# Patient Record
Sex: Female | Born: 1960
Health system: Southern US, Community
[De-identification: ages and names within clinical notes are randomized; demographics above are authoritative.]

## PROBLEM LIST (undated history)

## (undated) DIAGNOSIS — I10 Essential (primary) hypertension: Secondary | ICD-10-CM

## (undated) DIAGNOSIS — F419 Anxiety disorder, unspecified: Secondary | ICD-10-CM

## (undated) DIAGNOSIS — E7800 Pure hypercholesterolemia, unspecified: Secondary | ICD-10-CM

## (undated) DIAGNOSIS — M1990 Unspecified osteoarthritis, unspecified site: Secondary | ICD-10-CM

## (undated) DIAGNOSIS — F329 Major depressive disorder, single episode, unspecified: Secondary | ICD-10-CM

## (undated) DIAGNOSIS — T7840XA Allergy, unspecified, initial encounter: Secondary | ICD-10-CM

## (undated) DIAGNOSIS — F32A Depression, unspecified: Secondary | ICD-10-CM

## (undated) DIAGNOSIS — L409 Psoriasis, unspecified: Secondary | ICD-10-CM

## (undated) DIAGNOSIS — K219 Gastro-esophageal reflux disease without esophagitis: Secondary | ICD-10-CM

## (undated) DIAGNOSIS — N1831 Chronic kidney disease, stage 3a: Secondary | ICD-10-CM

## (undated) DIAGNOSIS — K8020 Calculus of gallbladder without cholecystitis without obstruction: Secondary | ICD-10-CM

## (undated) DIAGNOSIS — N200 Calculus of kidney: Secondary | ICD-10-CM

## (undated) DIAGNOSIS — E079 Disorder of thyroid, unspecified: Secondary | ICD-10-CM

## (undated) HISTORY — PX: CHOLECYSTECTOMY: SHX55

## (undated) HISTORY — DX: Calculus of gallbladder without cholecystitis without obstruction: K80.20

## (undated) HISTORY — PX: BREAST LUMPECTOMY: SHX2

## (undated) HISTORY — PX: COLONOSCOPY: SHX174

## (undated) HISTORY — DX: Pure hypercholesterolemia, unspecified: E78.00

## (undated) HISTORY — DX: Depression, unspecified: F32.A

## (undated) HISTORY — DX: Anxiety disorder, unspecified: F41.9

## (undated) HISTORY — DX: Chronic kidney disease, stage 3a: N18.31

## (undated) HISTORY — DX: Gastro-esophageal reflux disease without esophagitis: K21.9

## (undated) HISTORY — PX: UPPER GASTROINTESTINAL ENDOSCOPY: SHX188

## (undated) HISTORY — DX: Disorder of thyroid, unspecified: E07.9

## (undated) HISTORY — DX: Unspecified osteoarthritis, unspecified site: M19.90

## (undated) HISTORY — DX: Calculus of kidney: N20.0

## (undated) HISTORY — DX: Essential (primary) hypertension: I10

## (undated) HISTORY — DX: Psoriasis, unspecified: L40.9

## (undated) HISTORY — PX: ABDOMINAL HYSTERECTOMY: SHX81

## (undated) HISTORY — DX: Allergy, unspecified, initial encounter: T78.40XA

## (undated) HISTORY — DX: Major depressive disorder, single episode, unspecified: F32.9

---

## 1997-11-18 ENCOUNTER — Other Ambulatory Visit: Admission: RE | Admit: 1997-11-18 | Discharge: 1997-11-18 | Payer: Self-pay | Admitting: Obstetrics and Gynecology

## 1998-12-07 ENCOUNTER — Other Ambulatory Visit: Admission: RE | Admit: 1998-12-07 | Discharge: 1998-12-07 | Payer: Self-pay | Admitting: Obstetrics and Gynecology

## 1999-01-14 ENCOUNTER — Ambulatory Visit (HOSPITAL_COMMUNITY): Admission: RE | Admit: 1999-01-14 | Discharge: 1999-01-14 | Payer: Self-pay | Admitting: Obstetrics and Gynecology

## 2000-01-25 ENCOUNTER — Other Ambulatory Visit: Admission: RE | Admit: 2000-01-25 | Discharge: 2000-01-25 | Payer: Self-pay | Admitting: Obstetrics and Gynecology

## 2001-10-05 ENCOUNTER — Encounter (INDEPENDENT_AMBULATORY_CARE_PROVIDER_SITE_OTHER): Payer: Self-pay | Admitting: Specialist

## 2001-10-05 ENCOUNTER — Inpatient Hospital Stay (HOSPITAL_COMMUNITY): Admission: EM | Admit: 2001-10-05 | Discharge: 2001-10-07 | Payer: Self-pay | Admitting: Emergency Medicine

## 2001-10-05 ENCOUNTER — Encounter: Payer: Self-pay | Admitting: Emergency Medicine

## 2001-10-06 ENCOUNTER — Encounter: Payer: Self-pay | Admitting: Surgery

## 2002-03-19 ENCOUNTER — Other Ambulatory Visit: Admission: RE | Admit: 2002-03-19 | Discharge: 2002-03-19 | Payer: Self-pay | Admitting: Obstetrics and Gynecology

## 2002-05-20 ENCOUNTER — Ambulatory Visit (HOSPITAL_COMMUNITY): Admission: RE | Admit: 2002-05-20 | Discharge: 2002-05-20 | Payer: Self-pay | Admitting: Obstetrics and Gynecology

## 2002-05-20 ENCOUNTER — Encounter: Payer: Self-pay | Admitting: Obstetrics and Gynecology

## 2002-06-19 ENCOUNTER — Observation Stay (HOSPITAL_COMMUNITY): Admission: RE | Admit: 2002-06-19 | Discharge: 2002-06-20 | Payer: Self-pay | Admitting: Obstetrics and Gynecology

## 2002-06-19 ENCOUNTER — Encounter (INDEPENDENT_AMBULATORY_CARE_PROVIDER_SITE_OTHER): Payer: Self-pay | Admitting: Specialist

## 2003-06-01 ENCOUNTER — Other Ambulatory Visit: Admission: RE | Admit: 2003-06-01 | Discharge: 2003-06-01 | Payer: Self-pay | Admitting: Obstetrics and Gynecology

## 2004-06-03 ENCOUNTER — Other Ambulatory Visit: Admission: RE | Admit: 2004-06-03 | Discharge: 2004-06-03 | Payer: Self-pay | Admitting: Obstetrics and Gynecology

## 2005-12-29 ENCOUNTER — Ambulatory Visit (HOSPITAL_COMMUNITY): Admission: RE | Admit: 2005-12-29 | Discharge: 2005-12-29 | Payer: Self-pay | Admitting: Obstetrics and Gynecology

## 2005-12-29 ENCOUNTER — Encounter (INDEPENDENT_AMBULATORY_CARE_PROVIDER_SITE_OTHER): Payer: Self-pay | Admitting: Specialist

## 2007-07-23 ENCOUNTER — Encounter: Admission: RE | Admit: 2007-07-23 | Discharge: 2007-07-23 | Payer: Self-pay | Admitting: Gastroenterology

## 2007-08-20 ENCOUNTER — Encounter: Admission: RE | Admit: 2007-08-20 | Discharge: 2007-08-20 | Payer: Self-pay | Admitting: Gastroenterology

## 2007-09-03 ENCOUNTER — Ambulatory Visit (HOSPITAL_COMMUNITY): Admission: RE | Admit: 2007-09-03 | Discharge: 2007-09-03 | Payer: Self-pay | Admitting: Gastroenterology

## 2007-09-11 ENCOUNTER — Emergency Department (HOSPITAL_COMMUNITY): Admission: EM | Admit: 2007-09-11 | Discharge: 2007-09-11 | Payer: Self-pay | Admitting: Emergency Medicine

## 2008-06-04 ENCOUNTER — Encounter: Admission: RE | Admit: 2008-06-04 | Discharge: 2008-06-04 | Payer: Self-pay | Admitting: Obstetrics and Gynecology

## 2010-01-26 IMAGING — RF DG ESOPHAGUS
12 of 14 series · 19 of 24 positions shown · non-contrast
Comparison: [REDACTED] chest x-ray report 10/06/2001

CLINICAL DATA: Heartburn and indigestion.  Intermittent solid
dysphasia upper esophagus.  Cholecystectomy.  Kapidex.

BARIUM SWALLOW / ESOPHAGRAM:
TECHNIQUE: Double and single contrast barium swallow.  Ingested 13
mm barium tablet with water.

[Series 1: run · 1 of 3 slices shown (1 of 12)]
[im 1/3]
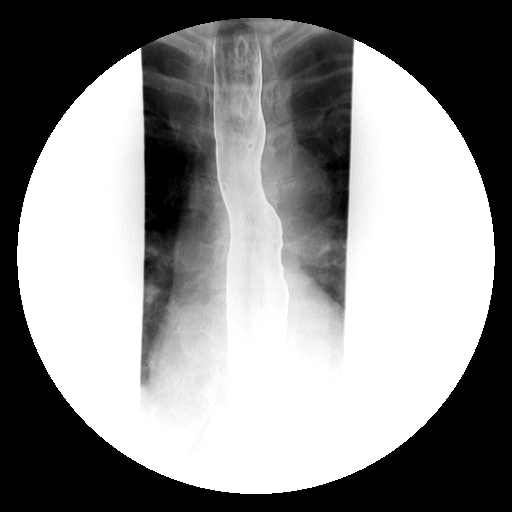

[Series 2: run · 1 of 2 slices shown (2 of 12)]
[im 1/2]
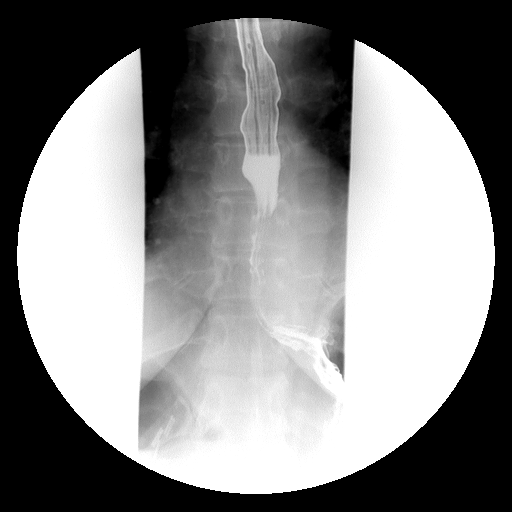

[Series 4: run · 1 of 6 slices shown (3 of 12)]
[im 1/6]
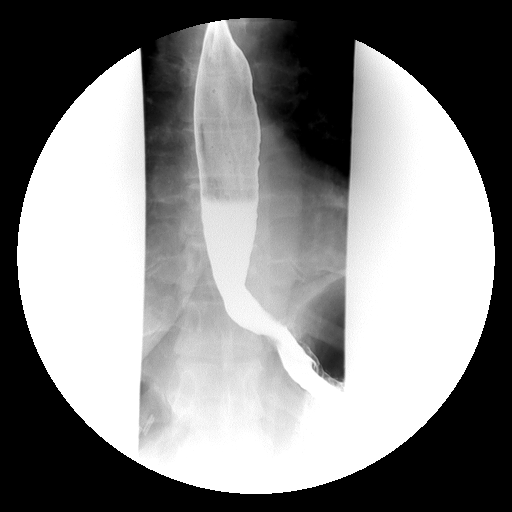

[Series 5: run · 2 of 10 slices shown (4 of 12)]
[im 1/10]
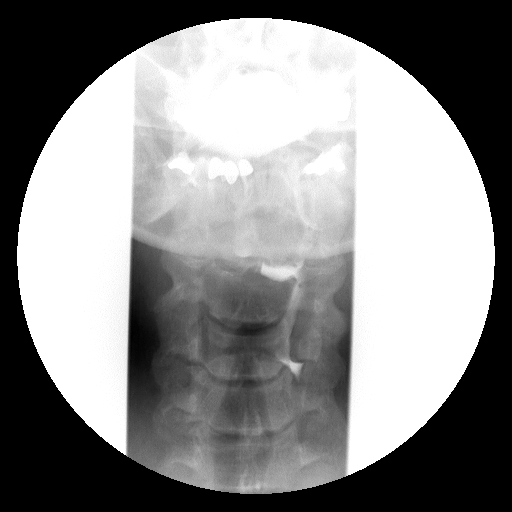
[im 10/10]
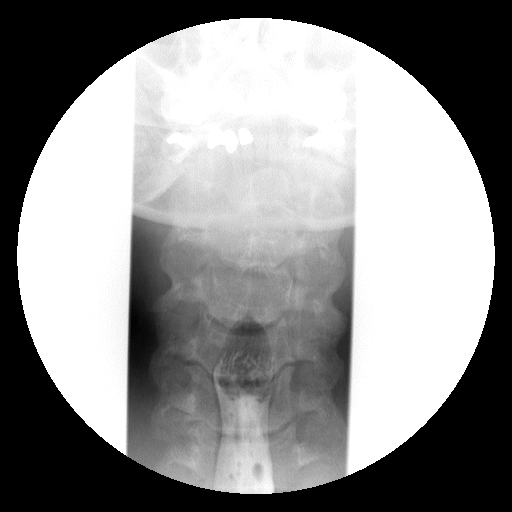

[Series 6: run · 1 of 3 slices shown (5 of 12)]
[im 1/3]
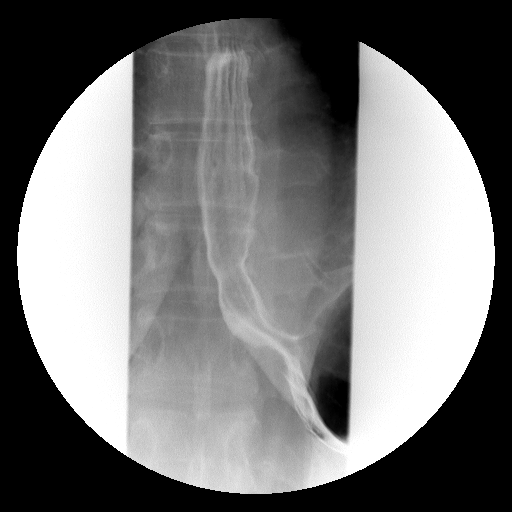

[Series 8: run · 1 of 1 slices shown (6 of 12)]
[im 1/1]
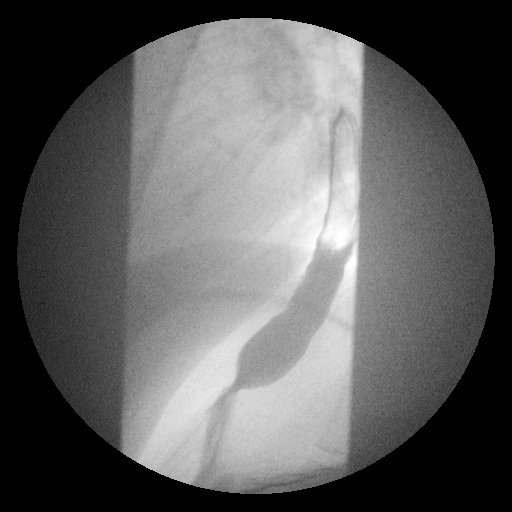

[Series 9: run · 5 of 21 slices shown (7 of 12)]
[im 1/21]
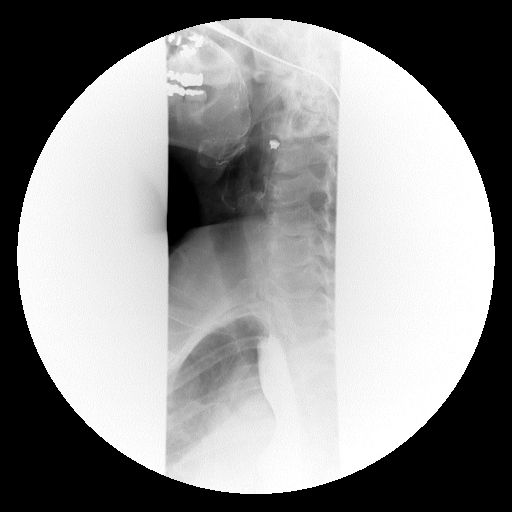
[im 5/21]
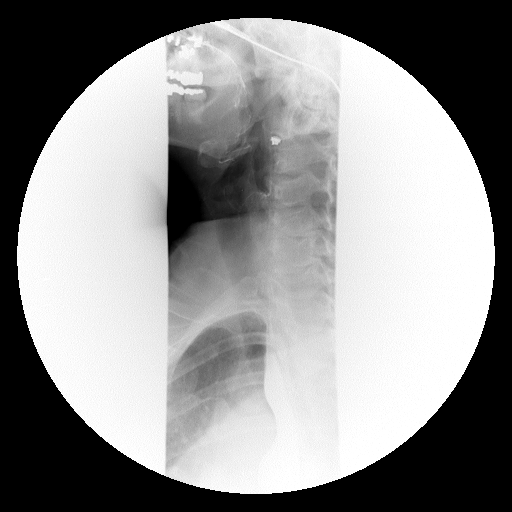
[im 13/21]
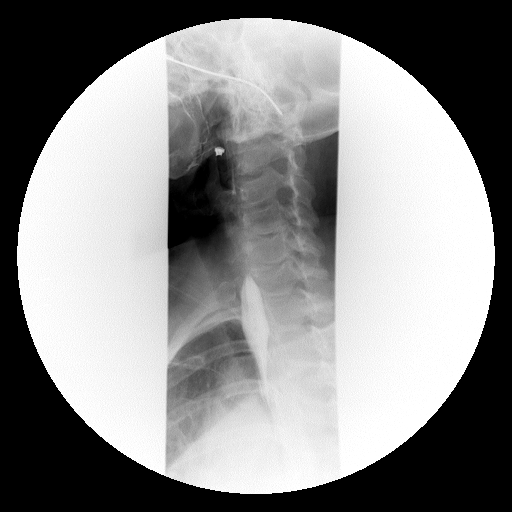
[im 17/21]
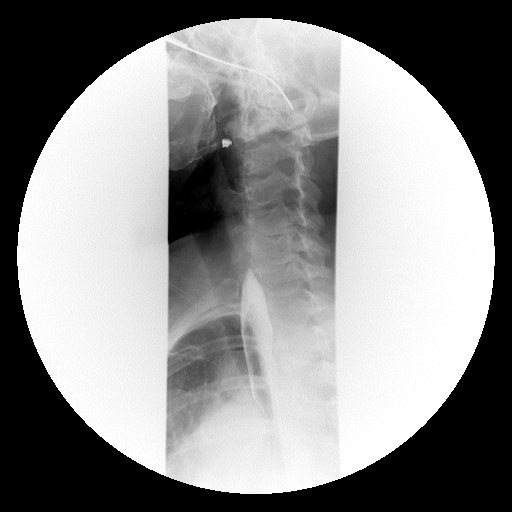
[im 21/21]
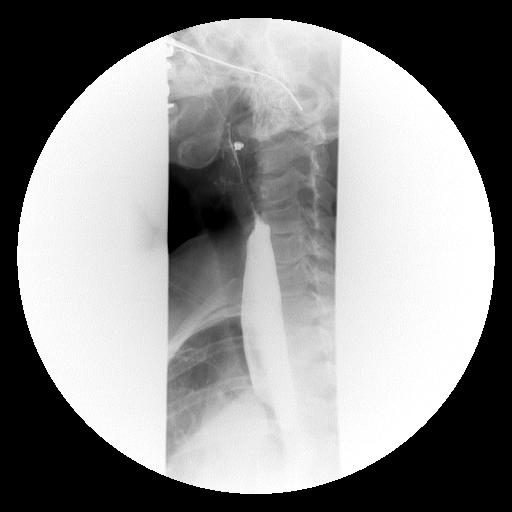

[Series 10: run · 2 of 11 slices shown (8 of 12)]
[im 1/11]
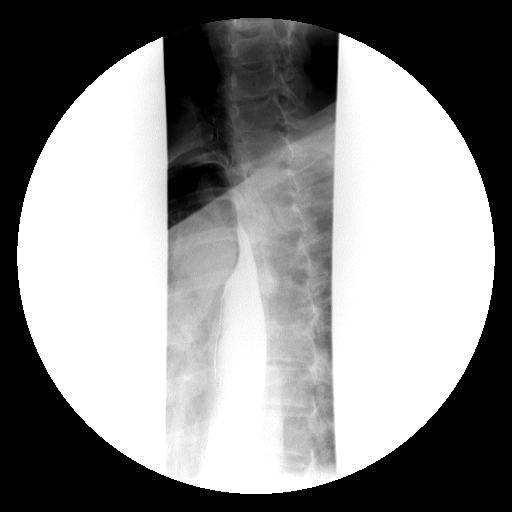
[im 11/11]
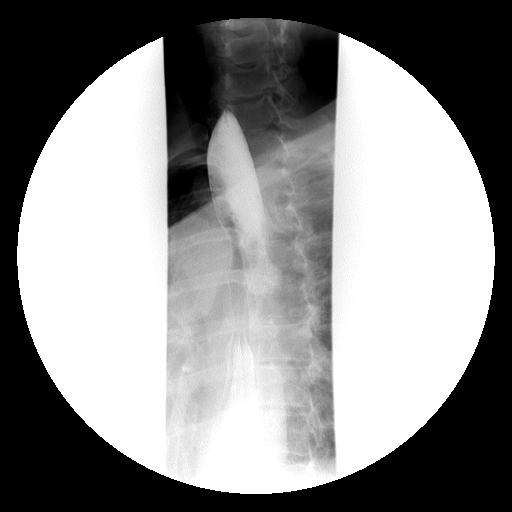

[Series 11: run · 1 of 4 slices shown (9 of 12)]
[im 1/4]
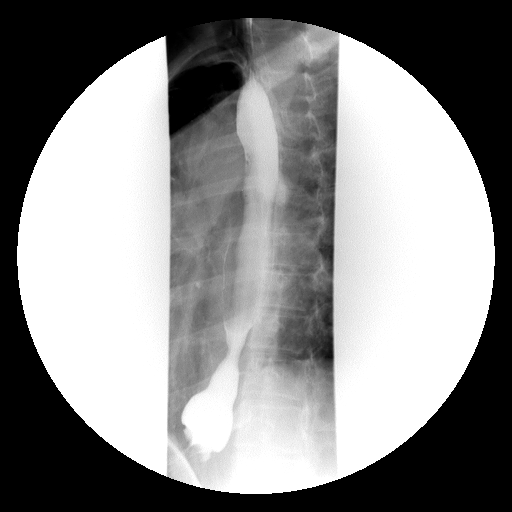

[Series 12: run · 2 of 11 slices shown (10 of 12)]
[im 1/11]
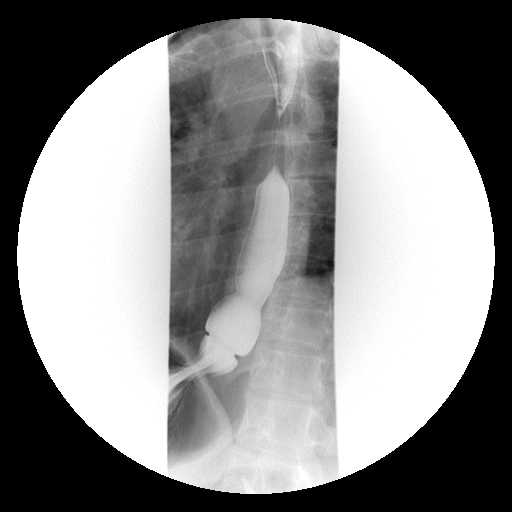
[im 6/11]
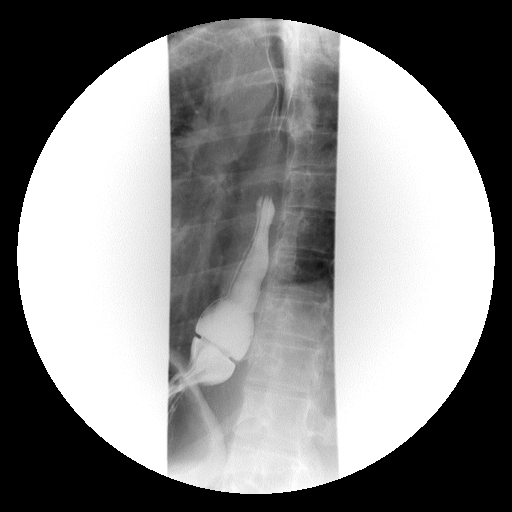

[Series 13: run · 1 of 1 slices shown (11 of 12)]
[im 1/1]
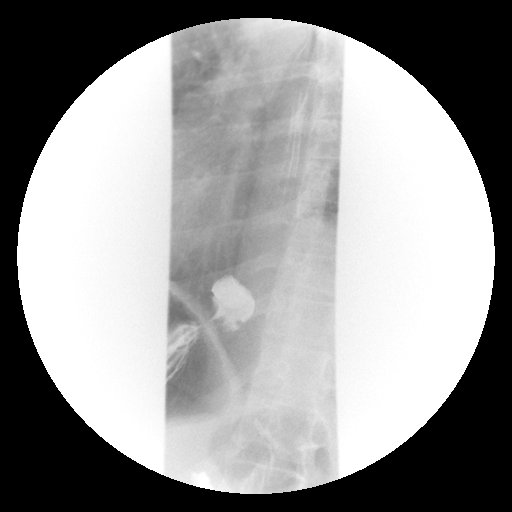

[Series 14: run · 1 of 1 slices shown (12 of 12)]
[im 1/1]
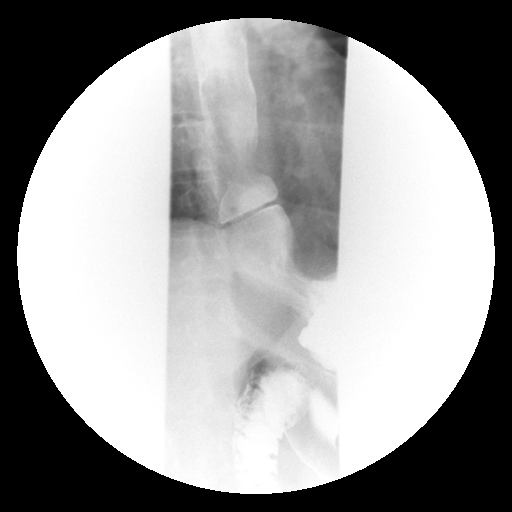

[19 of 24 positions shown; findings below may reference images not displayed]

FINDINGS: Normal antegrade peristalsis is seen through the cervical
and thoracic esophagus.  Small sliding type hiatus hernia is seen
with nonobstructing lower esophageal ring.  Ingested 13 mm barium
tablet readily passed into the stomach.  No current spontaneous or
induced (Valsalva/water siphon) gastroesophageal reflux was
demonstrated.  No other significant intrinsic or extrinsic
esophageal lesion is noted.
IMPRESSION: 1.  Small sliding type hiatus hernia with nonobstructing lower
esophageal ring.
2.  Otherwise, negative.

## 2010-03-06 ENCOUNTER — Encounter: Payer: Self-pay | Admitting: Obstetrics and Gynecology

## 2010-06-28 NOTE — Op Note (Signed)
Heidi Holt, Heidi Holt                    ACCOUNT NO.:  [id number]   MEDICAL RECORD NO.:  [id number]          PATIENT TYPE:  AMB   LOCATION:  ENDO                         FACILITY:  [facility]   PHYSICIAN:  Graylin Shiver, M.D.   DATE OF BIRTH:  July 28, 1960   DATE OF PROCEDURE:  09/03/2007  DATE OF DISCHARGE:                               OPERATIVE REPORT   PROCEDURE:  Esophagogastroduodenoscopy with biopsy for CLO-test and  endoscopic balloon dilatation of a Schatzki ring.   INDICATIONS:  Epigastric abdominal pain, dysphagia.  Barium swallow  showed a Schatzki ring.   Informed consent was obtained after explanation of the risks of  bleeding, infection, and perforation.   PREMEDICATION:  Fentanyl 100 mcg IV, Versed 10 mg IV.   PROCEDURE IN DETAIL:  With the patient in the left lateral decubitus  position, the Pentax gastroscope was inserted into the oropharynx and  passed into the esophagus.  It was advanced down the esophagus then into  the stomach and into the duodenum.  The second portion and bulb of the  duodenum looked normal.  The antrum of stomach was erythematous,  compatible with a mild antral gastritis.  A biopsy for CLO-test was  obtained.  No ulcers or erosions were seen.  The body of the stomach  looked normal.  The fundus and cardia had normal-appearing mucosa.  There was a small hiatal hernia.  The distal esophagus revealed a  Schatzki ring.  This did not narrow the lumen at tremendous amount but  it did a little bit with contraction.  An endoscopic balloon dilator was  advanced down the scope and appropriately placed at the level of the  ring.  It was inflated to 16.5, then 18 mm and held in place at each  level for 1 minute.  The rest of the esophagus looked normal.  She  tolerated the procedure well without complications.   IMPRESSION:  1. Schatzki ring, dilated to 18 mm.  2. Hiatal hernia.  3. Antral gastritis.  Biopsy for CLO-test obtained.     ______________________________  Graylin Shiver, M.D.     SFG/MEDQ  D:  09/03/2007  T:  09/04/2007  Job:  [id number]   cc:   Tally Joe, M.D.
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
## 2010-07-01 NOTE — Op Note (Signed)
NAME:  Heidi Holt, Heidi Holt NO.:  [id number]   MEDICAL RECORD NO.:  [id number]                   PATIENT TYPE:   LOCATION:                                       FACILITY:   PHYSICIAN:  Sandria Bales. Ezzard Standing, M.D.               DATE OF BIRTH:  April 18, 1960   DATE OF PROCEDURE:  10/06/2001  DATE OF DISCHARGE:                                 OPERATIVE REPORT   PREOPERATIVE DIAGNOSES:  Cholecystitis with cholelithiasis.   POSTOPERATIVE DIAGNOSES:  Cholecystitis with cholelithiasis.   OPERATION PERFORMED:  Laparoscopic cholecystectomy with intraoperative  cholangiogram.   SURGEON:  Sandria Bales. Ezzard Standing, M.D.   ASSISTANT:  Adolph Pollack, M.D.   ANESTHESIA:  General endotracheal.   ESTIMATED BLOOD LOSS:  Minimal.   INDICATIONS FOR PROCEDURE:  The patient is a 50 year old white female who  has had some chronic symptoms actually going on almost a year but presented  acutely yesterday to the emergency department with right upper quadrant  pain.  She had a normal white blood cell count.  She had ultrasound which  showed apparent impacted gallstone.  She is taken to the operating room for  attempted laparoscopic cholecystectomy.   DESCRIPTION OF PROCEDURE:  The patient was placed in supine position and  given a general endotracheal anesthetic.  She already had Ancef as an  antibiotic.  She had PAS stockings in place.  Orogastric tube was in place.  Her abdomen was prepped with Betadine solution and sterilely draped.  An  infraumbilical incision was made with sharp dissection carried down into the  abdominal cavity.  A 0 degree laparoscope was then inserted through a 12 mm  Hasson trocar.  The Hasson trocar was secured with a 0 Vicryl suture.  The  right and left lobe of the liver was unremarkable.  The stomach was  unremarkable.  The remainder of the bowel that I could see was unremarkable.  Three additional trocars were placed.  A 10 mm Ethicon trocar in the  subxiphoid location, a 5 mm Ethicon trocar in the right midsubcostal and a 5  mm Ethicon trocar in the right lateral subcostal location.  The gallbladder  was grasped, rotated cephalad.  It had a lot of adhesions between the  duodenum and the gallbladder.  Actually, the gallbladder was somewhat  peculiar in shape in that it went down paralleling the common bile duct  actually the infundibulum wrapped kind of behind the common bile duct.  It  made a little bit of a confusing dissection,  but I thought I identified the  common bile duct well and did not damage it.  I did have to tease off the  gallbladder off the common bile duct.  Finally got it straightened out where  I could develop a window showing the triangle of Calot.  The  cystic artery  was doubly endoclipped and divided.  I then shot an intraoperative  cholangiogram.   Intraoperative cholangiogram was shot using a cut off taut catheter inserted  through a 14 gauge Jelco into the side of the cut cystic duct.  Contrast was  injected down the cystic duct into the common bile duct and the duodenum.  There was no filling defect, no mass.  This was felt to be a normal  intraoperative cholangiogram.  I then removed the taut catheter and placed  three clips across the cystic duct and divided the cystic duct.  I then  sharply and bluntly dissected the gallbladder from the gallbladder bed.  The  gallbladder was actually noted to be fairly intrahepatic and had some  bleeding along the gallbladder bed which was controlled with Bovie  electrocautery.  I then put a half a piece of Surgicel into the gallbladder  bed.  __________  gallbladder bed, placed an EndoCatch bag and delivered it  through the umbilicus.   Went back and irrigated the abdomen with about 500 to 700 cc of saline.  Visualized the gallbladder bed, revisualized the triangle of Calot.  Saw no  bleeding at either site nor bile leak.  I then removed all my trocars under  direct  visualization.  The umbilical trocar was closed with a 0 Vicryl  suture.  The skin at each site was closed with a 5-0 Vicryl suture painted  with tincture of benzoin, steri-stripped and sterilely dressed.  The patient  tolerated the procedure well and was transported to the recovery room in  good condition.  Sponge and needle count correct at the end of the case.                                                 Sandria Bales. Ezzard Standing, M.D.    DHN/MEDQ  D:  10/06/2001  T:  10/08/2001  Job:  [id number]   cc:   Guy Sandifer. Arleta Creek, M.D.   [facility]
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
## 2010-07-01 NOTE — Discharge Summary (Signed)
NAME:  Heidi Holt, Heidi Holt                              ACCOUNT NO.:  [id number]   MEDICAL RECORD NO.:  [id number]                   PATIENT TYPE:  INP   LOCATION:  5733                                 FACILITY:  [facility]   PHYSICIAN:  Sandria Bales. Ezzard Standing, M.D.               DATE OF BIRTH:  April 07, 1960   DATE OF ADMISSION:  10/05/2001  DATE OF DISCHARGE:  10/07/2001                                 DISCHARGE SUMMARY   DISCHARGE DIAGNOSES:  1. Subacute cholecystitis with chololithiasis.  2. History of anxiety.  3. LATEX allergy.   OPERATION:  The patient had a laparoscopic cholecystectomy with  intraoperative cholangiogram on October 06, 2001.   HISTORY OF PRESENT ILLNESS:  The patient is a 50 year old white female who  is followed by [facility] and by Dr. Guy Sandifer. Tomblin from a GYN standpoint,  who has had vague abdominal symptoms for approximately one year.  Over the  last three weeks prior to this admission she had increasing vague abdominal  complaints and pain, but did not seek medical attention until on August 23rd  she awoke with severe pain which was located to her right upper quadrant,  with nausea.  She had no other history of GI symptoms or problems.  She did  have a strong family history of gallstone and gallstone disease.   ALLERGIES:  LATEX allergy.   MEDICATIONS:  Lexapro for anxiety.   SOCIAL HISTORY:  She works as a Catering manager.   PHYSICAL EXAMINATION:  VITAL SIGNS:  Temperature 97.6 degrees, pulse 87.  ABDOMEN:  She had right upper quadrant soreness but without rebound.   LABORATORY DATA:  Her white blood count was 8800.  Her alkaline phosphatase  was 83.  Her other labs were all within normal limits.   Her ultrasound showed what appeared to be a solitary gallstone, but this  appeared to be in impacted in her gallbladder neck.   HOSPITAL COURSE:  She was admitted with a diagnosis of subacute  cholecystitis.  Unfortunately she had drunk some liquids right before I saw  her, so I decided to  admit her for surgery, and took her to the operating  room on the following day where I did a laparoscopic cholecystectomy with  intraoperative cholangiogram.  She has done well.  She is eating.  She is  afebrile.   DISPOSITION:  She is now ready for discharge.   DISCHARGE INSTRUCTIONS:  Vicodin for pain.  She should not do heavy lifting  or driving for three or four days.  She can resume working in about a week.  She should be on a low-fat diet.  She can shower starting tomorrow.   FOLLOW UP:  She will see me back in two weeks for a follow-up evaluation.  Her pathology report is pending at the time of this dictation.  Sandria Bales. Ezzard Standing, M.D.    DHN/MEDQ  D:  10/07/2001  T:  10/08/2001  Job:  [id number]   cc:   [facility]   Guy Sandifer. Arleta Creek, M.D.

## 2010-07-01 NOTE — Op Note (Signed)
NAME:  Heidi Holt, Heidi Holt                              ACCOUNT NO.:  [id number]   MEDICAL RECORD NO.:  [id number]                   PATIENT TYPE:  OBV   LOCATION:  9327                                 FACILITY:  [facility]   PHYSICIAN:  Guy Sandifer. Arleta Creek, M.D.           DATE OF BIRTH:  1960-07-02   DATE OF PROCEDURE:  06/19/2002  DATE OF DISCHARGE:                                 OPERATIVE REPORT   PREOPERATIVE DIAGNOSES:  Menorrhagia.   POSTOPERATIVE DIAGNOSES:  1. Menorrhagia.  2. Endometriosis.   PROCEDURE:  Laparoscopically assisted vaginal hysterectomy and ablation of  endometriosis.   SURGEON:  Guy Sandifer. Henderson Cloud, M.D.   ASSISTANT:  Duke Salvia. Marcelle Overlie, M.D.   ANESTHESIA:  1. General with endotracheal intubation.  2. 0.5% plain Marcaine local anesthetic to the incisions.   ESTIMATED BLOOD LOSS:  200 mL.   SPECIMENS:  Uterus.   INDICATIONS AND CONSENT:  This patient is a 50 year old married white female  G2, P2 status post tubal ligation with menorrhagia.  Details are dictated in  the history and physical.  Laparoscopically assisted vaginal hysterectomy is  discussed with the patient.  Retention of ovaries is discussed if they  appear normal.  Potential risks and complications are discussed including,  but not limited to, infection, bowel, bladder, ureteral damage, bleeding  requiring transfusion of blood products with possible transfusion reaction,  HIV and hepatitis acquisition, DVT, PE, pneumonia, fistula formation,  laparotomy, and dyspareunia.  All questions have been answered and consent  is signed on the chart.   FINDINGS:  Upper abdomen is grossly normal.  There is a dark black implant  of endometriosis immediately about the pelvic brim on the left and right  anterior abdominal wall.  Uterus is about 6 weeks in size, smooth in  contour.  Filshie clips are on the fallopian tubes bilaterally.  Ovaries are  normal bilaterally.  Anterior/posterior cul-de-sacs are normal.   PROCEDURE:  The patient is taken to the operating room, placed in the dorsal  supine position where general anesthesia is induced via endotracheal  intubation.  She is then placed in the dorsal lithotomy position where she  is prepped abdominally and vaginally.  Bladder straight catheterized.  Hulka  tenaculum was placed in the uterus as a manipulator and she is draped in a  sterile fashion.  Latex free materials and gloves are used.  Small  infraumbilical incision is made and with the aid of towel clips on each side  of the umbilicus a disposable gyrus 10-11 trocar is placed without  difficulty.  Placement is verified with the laparoscope and no damage to  surrounding structures is noted.  Pneumoperitoneum is induced.  Small  suprapubic incision is made and a 5 mm disposable trocar sleeve is placed  under direct visualization without difficulty.  The above findings are  noted.  Then, using the gyrus bipolar cautery cutting instrument the  proximal ligaments are taken down bilaterally to the level of the  vesicouterine peritoneum.  The left Filshie clip is incorporated in this  pedicle.  The right Filshie clip is too distal to be incorporated.  The  vesicouterine peritoneum is then incised in the midline, hydrodissected, and  taken down cephalolaterally.  Attention is then turned to the vagina.  Posterior cul-de-sac is entered sharply with scissors and cervix is  circumscribed with a scalpel.  Mucosa is advanced sharply and bluntly.  Uterosacral ligaments are taken down bilaterally and are ligated with  transfixion sutures of 0 Monocryl.  All suture is 0 Monocryl unless  otherwise designated.  Bladder pillars followed by the cardinal ligaments  were taken bilaterally.  Anterior cul-de-sac is entered without difficulty.  Uterine vessels are taken bilaterally and a bite above the level of the  uterine vessels is taken bilaterally as well.  Fundus is delivered  posteriorly.  Remaining  ligaments are taken down and the specimen is  delivered.  The remaining pedicles are ligated with free ties.  The  uterosacral ligaments are plicated to the vaginal cuff bilaterally.  Uterosacral ligaments are then plicated in the midline with an additional  suture.  Cuff is closed with figure-of-eight sutures.  Good hemostasis is  obtained.  Foley catheter is placed in the bladder and clear urine is noted.  Attention is returned to the above.  Small bleeders at peritoneal edges are  controlled with bipolar cautery.  Excess fluid is removed.  This is  inspected multiple times under reduced pneumoperitoneum to assure  hemostasis.  Suprapubic trocar sleeve is removed.  Pneumoperitoneum is  reduced and the umbilical trocar sleeve is removed.  A single 2-0 Vicryl  suture was placed in the deeper underlying tissues in the umbilical incision  with care being taken not to pick up any underlying structures.  The  incisions were injected with 0.5% plain Marcaine.  Dermabond is used to  close the skin incisions.  All counts correct.  The patient is awakened,  taken to recovery room in stable condition.                                               Guy Sandifer Arleta Creek, M.D.    JET/MEDQ  D:  06/19/2002  T:  06/19/2002  Job:  [id number]

## 2010-07-01 NOTE — Op Note (Signed)
[name], Heidi Holt                    ACCOUNT NO.:  [id number]   MEDICAL RECORD NO.:  [id number]          PATIENT TYPE:  AMB   LOCATION:  SDC                           FACILITY:  [facility]   PHYSICIAN:  Guy Sandifer. Henderson Cloud, M.D. DATE OF BIRTH:  07-15-1960   DATE OF PROCEDURE:  12/29/2005  DATE OF DISCHARGE:                                 OPERATIVE REPORT   PREOPERATIVE DIAGNOSIS:  1. Pelvic pain.  2. Left ovarian cyst.   POSTOPERATIVE DIAGNOSIS:  1. Pelvic pain.  2. Left ovarian cyst.   Same   PROCEDURE:  Laparoscopic bilateral salpingo-oophorectomy.   SURGEON:  Guy Sandifer. Henderson Cloud, M.D.   ANESTHESIA:  General endotracheal anesthesia.   CLINICAL HISTORY:  General endotracheal intubation, Quillian Quince, M.D.   SPECIMENS:  Bilateral tubes and ovaries to pathology.   ESTIMATED BLOOD LOSS:  Drops.   INDICATIONS AND CONSENT:  This patient is a 50 year old married white female  G2, P2, status post LAVH who has a cyst and pelvic pain.  Details are  dictated in the history and physical.  Laparoscopic bilateral salpingo-  oophorectomy has been discussed with the patient.  The potential risks and  complications were discussed preoperatively including but limited to  infection, bowel, bladder, and ureteral damage, bleeding requiring  transfusion of blood products with possible transfusion reaction, HIV and  hepatitis acquisition, DVT, PE, pneumonia, and laparotomy.  Issues of  menopause have also been discussed.  All questions were answered and consent  is signed on the chart.   FINDINGS:  The upper abdomen is grossly normal.  Right tube and ovary  normal.  Left ovary contains a 2-cm smooth cyst.   DESCRIPTION OF PROCEDURE:  The patient was taken to the operating room where  she was identified, placed in the dorsal supine position, and general  anesthesia is induced via endotracheal intubation.  She is then placed in  the dorsal lithotomy position, prepped abdominally and vaginally, the  bladder is straight catheterized, and she is draped in a sterile fashion.  The infraumbilical and suprapubic areas are injected in the midline with  0.5% plain Marcaine.  A small infraumbilical incision is made and a  disposable Veress needle was placed.  A normal syringe and drop test are  noted.  2 liters of gas were insufflated under low pressure with good  tympany in the right upper quadrant.  The Veress needle was removed.  A  10/11 disposable bladeless XL trocar sleeve was placed using direct  visualization with the diagnostic laparoscopic.  This was replaced with the  operative laparoscopic.  A small suprapubic incision is made in the midline  and a 5-mm bladeless XL disposable trocar sleeve was placed under direct  visualization without difficulty.  The above findings are noted.  The course  of the ureters is identified and seen to be well clear of the area of  surgery.  The right infundibulopelvic ligament was taken down with the gyrus  bipolar cautery cutting instrument.  The ovary is completely freed and  placed in the cul-de-sac.  The left ovary  is then released in a similar  fashion.  Good hemostasis is noted throughout.  Then, using the 5-mm  laparoscopic through the suprapubic trocar sleeve, the laparoscopic pouch is  used to retrieve the ovary and tube through the umbilical incision.  A  similar procedure is carried out to retrieve the right ovary from the cul-de-  sac.  Careful inspection with the operative laparoscope again reveals  excellent hemostasis all around under reduced pneumoperitoneum.  The  suprapubic trocar sleeve was removed.  The pneumoperitoneum is completely  reduce. The umbilical trocar sleeve was removed.  Then, using retraction and  Kocher clamps to grasp the anterior fascia of the umbilical incision under  good visualization, a 0 Vicryl suture is used in simple suture to close the  fascia.  A 2-0 Vicryl suture is then used to reapproximate the skin on  the  umbilical incision with interrupted sutures.  A single stitch is also placed  through the suprapubic incision.  Dermabond was placed on both.  All counts  correct.  The patient is awakened and taken to the recovery room in stable  condition.      Guy Sandifer Henderson Cloud, M.D.  Electronically Signed     JET/MEDQ  D:  12/29/2005  T:  12/29/2005  Job:  [id number]

## 2010-07-01 NOTE — H&P (Signed)
NAME:  Heidi Holt, Heidi Holt                              ACCOUNT NO.:  [id number]   MEDICAL RECORD NO.:  [id number]                   PATIENT TYPE:  INP   LOCATION:  1827                                 FACILITY:  [facility]   PHYSICIAN:  Sandria Bales. Ezzard Standing, M.D.               DATE OF BIRTH:  08/10/1960   DATE OF ADMISSION:  10/05/2001  DATE OF DISCHARGE:                                HISTORY & PHYSICAL   HISTORY OF ILLNESS:  The patient is a 50 year old white female who usually  uses [facility] for her primary care, has used Dr. Deeann Dowse, and Dr.  Thressa Sheller for her GYN care.  She has had some vague abdominal symptoms  which have been epigastric for about the past year but they have never been  very specific.  Over the last 3 weeks, she says she has had increasing pain  and symptoms and this morning awoke about 9 a.m. with fairly severe right  upper quadrant pain and nausea but no vomiting.  She denied any history of  peptic ulcer disease, liver disease, pancreatic disease, colon disease.  Her  only prior abdominal surgery was a tubal ligation a number or years ago.   ALLERGIES:  She has a past medical history in which she is allergic to  LATEX.   CURRENT MEDICATION:  Lexapro.   REVIEW OF SYSTEMS:  PULMONARY: She had a remote bronchitis but nothing  recent.  She does not smoke cigarettes.  CARDIAC: Negative for heart  disease, chest pain, or hypertension.  GASTROINTESTINAL: Denies peptic ulcer  disease.  See history of present illness.  UROLOGIC: No __________  kidney  infection.  GYN: She is a gravida 2, para 2, aborta 0, and she has had a  tubal ligation.  She is on her period right now, still having regular  periods.  She had mammography and this is reported as negative.  NEUROLOGICAL: Again, she is treated for anxiety by Dr. Deeann Dowse who  gave her this medicine Lexapro.   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   PHYSICAL EXAMINATION:  VITAL SIGNS: Her temperature is 97.6, blood pressure  129/69, pulse 87, respirations 18.  HEENT: Unremarkable.  NECK: Supple without mass, without thyromegaly.  LUNGS: Clear to auscultation.  She has symmetric breath sounds.  HEART: Regular rate and rhythm without murmur or rub.  ABDOMEN: Soreness in the right upper quadrant but she has no rebound, no  guarding, and I feel no palpable abdominal mass.  She has no evidence of a  hernia.  RECTAL: I did not do a rectal because she is currently on her period.  EXTREMITIES: __________  .  NEUROLOGICAL: Grossly intact.   LABORATORY DATA:  White blood count of 8800 with a hemoglobin of 12.  Her  sodium is 138, potassium 3.0, chloride  of 105, CO2 of 26, glucose of 96, her  alk phos is 83 and her amylase 84, lipase 32.  Urinalysis unremarkable.   Ultrasound shows what appeared to be a solitary gallstone impacted without  any obvious gallbladder wall thickening but she had tenderness she said on  her ultrasound.    IMPRESSION AND PLAN:  1. She has subacute cholecystitis.  Unfortunately, she drank about 1/2 cup     of Sprite right before I saw her and anesthesia would like a delay of 2-4     hours before performing any general anesthesia.  So, we will plan to     admit her to hospital, keep her overnight, keep her n.p.o. and then plan     cholecystectomy tomorrow morning.   I discussed with the patient the indication of the surgery, potential  complications including but not limited to infection, bleeding, bile duct  injury and open surgery.  She actually has several family members who had  gallbladder surgery and her mother had open gallbladder surgery and she is  aware of what this involves.   1. Anxiety; on medicine.   1. History of LATEX allergy.                                               Sandria Bales. Ezzard Standing, M.D.    DHN/MEDQ  D:  10/05/2001  T:  10/05/2001  Job:  [id number]   cc:   [facility]   Guy Sandifer.  Arleta Creek, M.D.

## 2014-09-23 ENCOUNTER — Other Ambulatory Visit: Payer: Self-pay | Admitting: Obstetrics and Gynecology

## 2014-09-23 DIAGNOSIS — N6320 Unspecified lump in the left breast, unspecified quadrant: Secondary | ICD-10-CM

## 2014-09-28 ENCOUNTER — Ambulatory Visit
Admission: RE | Admit: 2014-09-28 | Discharge: 2014-09-28 | Disposition: A | Discharge status: Home / Self Care | Payer: BLUE CROSS/BLUE SHIELD | Source: Ambulatory Visit | Attending: Obstetrics and Gynecology | Admitting: Obstetrics and Gynecology

## 2014-09-28 ENCOUNTER — Other Ambulatory Visit: Payer: Self-pay | Admitting: Obstetrics and Gynecology

## 2014-09-28 DIAGNOSIS — N6320 Unspecified lump in the left breast, unspecified quadrant: Secondary | ICD-10-CM

## 2014-11-09 ENCOUNTER — Ambulatory Visit: Payer: Self-pay | Admitting: Surgery

## 2014-12-22 ENCOUNTER — Other Ambulatory Visit: Payer: Self-pay | Admitting: Surgery

## 2014-12-22 ENCOUNTER — Other Ambulatory Visit (HOSPITAL_COMMUNITY)
Admission: RE | Admit: 2014-12-22 | Discharge: 2014-12-22 | Disposition: A | Discharge status: Home / Self Care | Payer: BLUE CROSS/BLUE SHIELD | Source: Ambulatory Visit | Attending: Surgery | Admitting: Surgery

## 2014-12-22 DIAGNOSIS — N63 Unspecified lump in breast: Secondary | ICD-10-CM | POA: Insufficient documentation

## 2015-08-24 ENCOUNTER — Encounter: Payer: Self-pay | Admitting: Internal Medicine

## 2015-08-31 ENCOUNTER — Encounter: Payer: Self-pay | Admitting: Internal Medicine

## 2015-08-31 ENCOUNTER — Encounter: Payer: Self-pay | Admitting: Physician Assistant

## 2015-08-31 ENCOUNTER — Ambulatory Visit (INDEPENDENT_AMBULATORY_CARE_PROVIDER_SITE_OTHER): Payer: BLUE CROSS/BLUE SHIELD | Admitting: Physician Assistant

## 2015-08-31 VITALS — BP 138/78 | HR 85 | Ht 67.5 in | Wt 213.0 lb

## 2015-08-31 DIAGNOSIS — K222 Esophageal obstruction: Secondary | ICD-10-CM | POA: Diagnosis not present

## 2015-08-31 DIAGNOSIS — R1310 Dysphagia, unspecified: Secondary | ICD-10-CM | POA: Diagnosis not present

## 2015-08-31 NOTE — Patient Instructions (Signed)

## 2015-08-31 NOTE — Progress Notes (Signed)
Patient ID: Heidi Holt, female   DOB: 06/26/1960, 55 y.o.   MRN: [id number]   Subjective:    Patient ID: Heidi Holt, female    DOB: 06/26/1960, 55 y.o.   MRN: [id number]  HPI Heidi Holt is a pleasant 55 year old white female self referred today for evaluation of dysphagia. She is generally in good health does have history of hypertension, obesity and is status post cholecystectomy. On further discussion she states that she did have an endoscopy 10 or more years ago done at [facility] and may have had her esophagus dilated. I do have a copy of the barium swallow done from 2009 that showed a distal esophageal ring which was nonobstructive. She also had colonoscopy done about 2 years ago in [place] for screening. She says this was negative and she was asked to follow-up in 10 years. Family history is negative for colon cancer. She says that she's been having her current symptoms over the past 3 years but has had much more difficulty over the past year and has gotten to the point where her symptoms are almost daily. She has altered her diet significantly and has been eating very soft foods like mashed potatoes and macaroni and cheese etc. and completely avoiding bread and meat at this point. She says she had an episode week or so ago when she was at the beach with choking on a piece of fish. Says she has no difficulty with liquids, though sometimes if she drinks carbonated beverages may have some sensation of dysphagia. With solid food she says food will feel as if it's lodging in her upper esophagus -sometimes will go down with drinking liquids and other times requires regurgitation.  Review of Systems Pertinent positive and negative review of systems were noted in the above HPI section.  All other review of systems was otherwise negative.  Outpatient Encounter Prescriptions as of 08/31/2015  Medication Sig  . DULoxetine (CYMBALTA) 60 MG capsule Take 60 mg by mouth daily.  Marland Kitchen. ibuprofen (ADVIL,MOTRIN) 200 MG  tablet Take 200 mg by mouth as needed.  Marland Kitchen. losartan-hydrochlorothiazide (HYZAAR) 100-25 MG tablet Take 1 tablet by mouth daily.   No facility-administered encounter medications on file as of 08/31/2015.   Allergies  Allergen Reactions  . Latex    There are no active problems to display for this patient.  Social History   Social History  . Marital Status: Married    Spouse Name: N/A  . Number of Children: N/A  . Years of Education: N/A   Occupational History  . Not on file.   Social History Main Topics  . Smoking status: Never Smoker   . Smokeless tobacco: Never Used  . Alcohol Use: No  . Drug Use: No  . Sexual Activity: Not on file   Other Topics Concern  . Not on file   Social History Narrative  . No narrative on file    Heidi Holt's family history includes Diabetes in her mother; Heart disease in her father and mother.      Objective:    Filed Vitals:   08/31/15 1019  BP: 138/78  Pulse: 85    Physical Exam well-developed white female in no acute distress, pleasant blood pressure 138/78 pulse 85 5 foot 7 weight 213 BMI 32.8. HEENT; nontraumatic normocephalic EOMI PERRLA sclera anicteric, Cardiovascular; regular rate and rhythm with S1-S2 no murmur or gallop, Pulmonary; clear bilaterally, Abdomen; soft nontender nondistended bowel sounds are active there is no palpable mass or hepatosplenomegaly, Rectal ;  exam not done, Ext; no clubbing cyanosis or edema skin warm and dry, Neuropsych ;mood and affect appropriate     Assessment & Plan:   #19 55 year old female with history of distal esophageal ring who presents with complaints of progressive solid food dysphagia particularly over the past 1 year with frequent episodes of regurgitation. She has no complaints of heartburn or indigestion. Suspect dysphagia secondary to distal esophageal ring versus distal esophageal stricture. #2: Neoplasia screening-up-to-date with colonoscopy with negative colonoscopy 2-3 years ago #3  obesity #4 hypertension  Plan;We will request records from [facility] in [place] for previous procedures. Patient will be scheduled for EGD with dilation with Dr. Leone Payor. Procedure discussed in detail with patient including risks and benefits and she is agreeable to proceed.   Chen Holzman S Damien Batty PA-C 08/31/2015   Cc: No ref. provider found

## 2015-09-01 NOTE — Progress Notes (Signed)
Agree with Ms. Esterwood's assessment and plan. Carl E. Gessner, MD, FACG   

## 2015-09-07 ENCOUNTER — Ambulatory Visit (AMBULATORY_SURGERY_CENTER): Payer: BLUE CROSS/BLUE SHIELD | Admitting: Internal Medicine

## 2015-09-07 ENCOUNTER — Encounter: Payer: Self-pay | Admitting: Internal Medicine

## 2015-09-07 VITALS — BP 152/82 | HR 64 | Temp 98.2°F | Resp 18 | Ht 67.0 in | Wt 213.0 lb

## 2015-09-07 DIAGNOSIS — K222 Esophageal obstruction: Secondary | ICD-10-CM

## 2015-09-07 DIAGNOSIS — R1310 Dysphagia, unspecified: Secondary | ICD-10-CM

## 2015-09-07 MED ORDER — PANTOPRAZOLE SODIUM 40 MG PO TBEC: 40.0000 mg | DELAYED_RELEASE_TABLET | Freq: Every day | ORAL | 3 refills | Status: DC

## 2015-09-07 NOTE — Op Note (Signed)
[facility] Patient Name: Heidi Holt Procedure Date: 09/07/2015 10:17 AM MRN: [id number] Endoscopist: Iva Boop , MD Age: 55 Referring MD:  Date of Birth: 18-Aug-1960 Gender: Female Account #: [id number] Procedure:                Upper GI endoscopy Indications:              Dysphagia Medicines:                Propofol per Anesthesia, Monitored Anesthesia Care Procedure:                Pre-Anesthesia Assessment:                           - Prior to the procedure, a History and Physical                            was performed, and patient medications and                            allergies were reviewed. The patient's tolerance of                            previous anesthesia was also reviewed. The risks                            and benefits of the procedure and the sedation                            options and risks were discussed with the patient.                            All questions were answered, and informed consent                            was obtained. Prior Anticoagulants: The patient has                            taken no previous anticoagulant or antiplatelet                            agents. ASA Grade Assessment: II - A patient with                            mild systemic disease. After reviewing the risks                            and benefits, the patient was deemed in                            satisfactory condition to undergo the procedure.                           After obtaining informed consent, the endoscope was  passed under direct vision. Throughout the                            procedure, the patient's blood pressure, pulse, and                            oxygen saturations were monitored continuously. The                            Model GIF-HQ190 [id number]) scope was introduced                            through the mouth, and advanced to the second part                            of duodenum. The upper  GI endoscopy was                            accomplished without difficulty. The patient                            tolerated the procedure well. Scope In: Scope Out: Findings:                 One moderate benign-appearing, intrinsic stenosis                            was found at the gastroesophageal junction. This                            measured 1.6 cm (inner diameter) x 1 cm (in length)                            and was traversed. A TTS dilator was passed through                            the scope. Dilation with an 18-19-20 mm balloon                            dilator was performed to 20 mm. The dilation site                            was examined and showed complete resolution of                            luminal narrowing. Estimated blood loss was minimal.                           A 3 cm hiatal hernia was present.                           The exam was otherwise without abnormality.  The cardia and gastric fundus were normal on                            retroflexion. Complications:            No immediate complications. Estimated Blood Loss:     Estimated blood loss was minimal. Impression:               - Benign-appearing esophageal stenosis. Dilated.                           - 3 cm hiatal hernia.                           - The examination was otherwise normal.                           - No specimens collected. Recommendation:           - Patient has a contact number available for                            emergencies. The signs and symptoms of potential                            delayed complications were discussed with the                            patient. Return to normal activities tomorrow.                            Written discharge instructions were provided to the                            patient.                           - Clear liquids x 1 hour then soft foods rest of                            day. Start prior diet  tomorrow.                           - Continue present medications.                           - Use Protonix (pantoprazole) 40 mg PO daily for                            the rest of the patient's life. Iva Boop, MD 09/07/2015 10:46:15 AM This report has been signed electronically.

## 2015-09-07 NOTE — Progress Notes (Signed)
A/ox3 pleased with MAC, report to Penny RN 

## 2015-09-07 NOTE — Progress Notes (Signed)
Patient ID: Heidi Holt, female   DOB: [birth year], 55 y.o.   MRN: [id number] Called to room to assist during endoscopic procedure.  Patient ID and intended procedure confirmed with present staff. Received instructions for my participation in the procedure from the performing physician.

## 2015-09-07 NOTE — Patient Instructions (Addendum)
I found and dilated a stricture of the esophagus. You should swallow better now. In order to prevent further problems with swallowing I am prescribing pantoprazole to block acid reflux which is the cause of the stricture.  If you still have or get swallowing problems again let me know.  I appreciate the opportunity to care for you. Iva Boop, MD, FACG   YOU HAD AN ENDOSCOPIC PROCEDURE TODAY AT THE [facility]:   Refer to the procedure report that was given to you for any specific questions about what was found during the examination.  If the procedure report does not answer your questions, please call your gastroenterologist to clarify.  If you requested that your care partner not be given the details of your procedure findings, then the procedure report has been included in a sealed envelope for you to review at your convenience later.  YOU SHOULD EXPECT: Some feelings of bloating in the abdomen. Passage of more gas than usual.  Walking can help get rid of the air that was put into your GI tract during the procedure and reduce the bloating. If you had a lower endoscopy (such as a colonoscopy or flexible sigmoidoscopy) you may notice spotting of blood in your stool or on the toilet paper. If you underwent a bowel prep for your procedure, you may not have a normal bowel movement for a few days.  Please Note:  You might notice some irritation and congestion in your nose or some drainage.  This is from the oxygen used during your procedure.  There is no need for concern and it should clear up in a day or so.  SYMPTOMS TO REPORT IMMEDIATELY:     Following upper endoscopy (EGD)  Vomiting of blood or coffee ground material  New chest pain or pain under the shoulder blades  Painful or persistently difficult swallowing  New shortness of breath  Fever of 100F or higher  Black, tarry-looking stools  For urgent or emergent issues, a gastroenterologist can be reached at  any hour by calling [phone].   DIET: Your first meal following the procedure should be a small meal and then it is ok to progress to your normal diet. Heavy or fried foods are harder to digest and may make you feel nauseous or bloated.  Likewise, meals heavy in dairy and vegetables can increase bloating.  Drink plenty of fluids but you should avoid alcoholic beverages for 24 hours.  ACTIVITY:  You should plan to take it easy for the rest of today and you should NOT DRIVE or use heavy machinery until tomorrow (because of the sedation medicines used during the test).    FOLLOW UP: Our staff will call the number listed on your records the next business day following your procedure to check on you and address any questions or concerns that you may have regarding the information given to you following your procedure. If we do not reach you, we will leave a message.  However, if you are feeling well and you are not experiencing any problems, there is no need to return our call.  We will assume that you have returned to your regular daily activities without incident.  If any biopsies were taken you will be contacted by phone or by letter within the next 1-3 weeks.  Please call us at [phone] if you have not heard about the biopsies in 3 weeks.    SIGNATURES/CONFIDENTIALITY: You and/or your care partner have signed paperwork  which will be entered into your electronic medical record.  These signatures attest to the fact that that the information above on your After Visit Summary has been reviewed and is understood.  Full responsibility of the confidentiality of this discharge information lies with you and/or your care-partner.  FOLLOW DILATATION DIET TODAY AND RETURN TO USUAL DIET TOMORROW AS TOLERATED   PROTONIX ORDERED BY DR Leone Payor AND SENT TO [facility]   REFLUX Benedetto Coons HERNIA INFORMATION GIVEN TO YOU ALSO

## 2015-09-08 ENCOUNTER — Telehealth: Payer: Self-pay | Admitting: *Deleted

## 2015-09-08 NOTE — Telephone Encounter (Signed)
Message left

## 2015-10-15 ENCOUNTER — Telehealth: Payer: Self-pay | Admitting: *Deleted

## 2015-10-15 NOTE — Telephone Encounter (Signed)
Faxed signed release of information form on 08-31-2015 asking for Colon and path report .  Got a reply back from [facility] [place]. They have no records on this patient.

## 2015-11-01 ENCOUNTER — Telehealth: Payer: Self-pay | Admitting: Internal Medicine

## 2015-11-01 ENCOUNTER — Ambulatory Visit: Payer: BLUE CROSS/BLUE SHIELD | Admitting: Internal Medicine

## 2015-11-01 NOTE — Telephone Encounter (Signed)
Patient reports that she has a new anxiety since starting her PPI and a new cholesterol meds.  She reports that she is taking cholesterol med ( she can't remember what it it) and cymbalta at the same time.  She is advised that most likely new side effects are the way she is taking her meds.  I explained that cholesterol meds can bind other meds and make them less effective. I asked her to try changing the way she is taking her meds and see if this helps.  I advised I would ask if PPI could be causing this and let her know his thoughts when Dr. Leone PayorGessner returns next week.

## 2015-11-01 NOTE — Telephone Encounter (Signed)
Do not think its from the PPI She should discuss with PCP

## 2015-11-02 NOTE — Telephone Encounter (Signed)
Patient notified of recommendations via voicemail.  I asked that she call back for any GI concerns

## 2016-03-10 ENCOUNTER — Ambulatory Visit
Admission: RE | Admit: 2016-03-10 | Discharge: 2016-03-10 | Disposition: A | Discharge status: Home / Self Care | Payer: BLUE CROSS/BLUE SHIELD | Source: Ambulatory Visit | Attending: Cardiology | Admitting: Cardiology

## 2016-03-10 ENCOUNTER — Other Ambulatory Visit: Payer: Self-pay | Admitting: Cardiology

## 2016-03-10 DIAGNOSIS — R0602 Shortness of breath: Secondary | ICD-10-CM

## 2016-04-20 ENCOUNTER — Encounter: Payer: Self-pay | Admitting: Internal Medicine

## 2016-04-20 ENCOUNTER — Ambulatory Visit (INDEPENDENT_AMBULATORY_CARE_PROVIDER_SITE_OTHER): Payer: BLUE CROSS/BLUE SHIELD | Admitting: Internal Medicine

## 2016-04-20 ENCOUNTER — Other Ambulatory Visit (INDEPENDENT_AMBULATORY_CARE_PROVIDER_SITE_OTHER): Payer: BLUE CROSS/BLUE SHIELD

## 2016-04-20 VITALS — BP 130/76 | HR 85 | Ht 68.0 in | Wt 227.0 lb

## 2016-04-20 DIAGNOSIS — R0600 Dyspnea, unspecified: Secondary | ICD-10-CM

## 2016-04-20 DIAGNOSIS — R0609 Other forms of dyspnea: Secondary | ICD-10-CM | POA: Diagnosis not present

## 2016-04-20 DIAGNOSIS — R058 Other specified cough: Secondary | ICD-10-CM

## 2016-04-20 DIAGNOSIS — R05 Cough: Secondary | ICD-10-CM | POA: Diagnosis not present

## 2016-04-20 LAB — CBC WITH DIFFERENTIAL/PLATELET
Basophils Absolute: 0.1 10*3/uL (ref 0.0–0.1)
Basophils Relative: 0.8 % (ref 0.0–3.0)
EOS PCT: 2.2 % (ref 0.0–5.0)
Eosinophils Absolute: 0.2 10*3/uL (ref 0.0–0.7)
HEMATOCRIT: 34.5 % — AB (ref 36.0–46.0)
HEMOGLOBIN: 11.6 g/dL — AB (ref 12.0–15.0)
LYMPHS PCT: 29.9 % (ref 12.0–46.0)
Lymphs Abs: 2.8 10*3/uL (ref 0.7–4.0)
MCHC: 33.7 g/dL (ref 30.0–36.0)
MCV: 91.4 fl (ref 78.0–100.0)
MONO ABS: 0.8 10*3/uL (ref 0.1–1.0)
MONOS PCT: 8.2 % (ref 3.0–12.0)
Neutro Abs: 5.6 10*3/uL (ref 1.4–7.7)
Neutrophils Relative %: 58.9 % (ref 43.0–77.0)
Platelets: 312 10*3/uL (ref 150.0–400.0)
RBC: 3.78 Mil/uL — AB (ref 3.87–5.11)
RDW: 13.2 % (ref 11.5–15.5)
WBC: 9.5 10*3/uL (ref 4.0–10.5)

## 2016-04-20 LAB — BASIC METABOLIC PANEL
BUN: 23 mg/dL (ref 6–23)
CALCIUM: 9.7 mg/dL (ref 8.4–10.5)
CHLORIDE: 103 meq/L (ref 96–112)
CO2: 28 meq/L (ref 19–32)
Creatinine, Ser: 1.39 mg/dL — ABNORMAL HIGH (ref 0.40–1.20)
GFR: 41.75 mL/min — ABNORMAL LOW (ref >60.00)
GLUCOSE: 101 mg/dL — AB (ref 70–99)
Potassium: 3.6 mEq/L (ref 3.5–5.1)
SODIUM: 138 meq/L (ref 135–145)

## 2016-04-20 LAB — TSH: TSH: 2.05 u[IU]/mL (ref 0.35–4.50)

## 2016-04-20 LAB — BRAIN NATRIURETIC PEPTIDE: Pro B Natriuretic peptide (BNP): 61 pg/mL (ref 0.0–100.0)

## 2016-04-20 MED ORDER — FAMOTIDINE 20 MG PO TABS: ORAL_TABLET | ORAL | 11 refills | Status: DC

## 2016-04-20 NOTE — Progress Notes (Signed)
Subjective:     Patient ID: Heidi Holt, female   DOB: 01-16-1961,    MRN: [id number]  HPI  30 yowf never smoker with spring >> fall   itchy eye/ runny nose/ sneeze cough /sob all her life eval by [facility] pos tree pollen and rx allergy shots in her 30's and seasonal flares maybe 50 % better and stayed the same pattern where  100% better between flares and just controlled with flonase/ zyrtec then worse since 2015 with pna and since then persistent coughing that disturbs sleep and  worse with activity so referred to pulmonary clinic 04/20/2016 by Dr   Azucena Cecil p unsatisfactory eval by [facility] ?  Why?  Has GERD/s/p dilation 09/07/15 with rec to stay on ppi lifetime per Dr Leone Payor    04/20/2016 1st  Pulmonary office visit/ Heidi Holt  maint on ppi 3 h ac  Chief Complaint  Patient presents with  . Pulmonary Consult    Referred by Dr. Tally Joe. Pt c/o DOE since PNA end of Nov 2017. She reports she gets out of breath with any exertion, even walking across the room or singing. She also c/o cough with white to yellow sputum. Cough occ wakes her in the night.   prior to 2015 could even jog at wt 165 but since then  on freq pred courses for "bronchitis" and gained 50 lb over 2-3 y Much worse sob since  Nov 2017  " pna " symbicort helped some but out since Jan and ? slt worse breathing since off but no change in cough  Has constant sensation while awake that can't take a deep breath even at rest assoc with dry cough day > noct and doe across the room  Cough worse with voice use esp singing.   No obvious day to day or daytime variability or assoc  mucus plugs or hemoptysis or cp or chest tightness, subjective wheeze or overt sinus or hb symptoms. No unusual exp hx or h/o childhood pna/ asthma or knowledge of premature birth.   Also denies any obvious fluctuation of symptoms with weather or environmental changes or other aggravating or alleviating factors except as outlined above   Current  Medications, Allergies, Complete Past Medical History, Past Surgical History, Family History, and Social History were reviewed in [employer] record.  ROS  The following are not active complaints unless bolded sore throat, dysphagia, dental problems, itching, sneezing,  nasal congestion or excess/ purulent secretions, ear ache,   fever, chills, sweats, unintended wt loss, classically pleuritic or exertional cp,  orthopnea pnd or leg swelling, presyncope, palpitations, abdominal pain, anorexia, nausea, vomiting, diarrhea  or change in bowel or bladder habits, change in stools or urine, dysuria,hematuria,  rash, arthralgias, visual complaints, headache, numbness, weakness or ataxia or problems with walking or coordination,  change in mood/affect or memory.         Review of Systems     Objective:   Physical Exam     Wt Readings from Last 3 Encounters:  04/20/16 227 lb (103 kg)  09/07/15 213 lb (96.6 kg)  08/31/15 213 lb (96.6 kg)    Vital signs reviewed - Note on arrival 02 sats  97% on RA     amb obese wf nad/ freq throat clearing   HEENT: nl dentition, turbinates bilaterally, and oropharynx. Nl external ear canals without cough reflex   NECK :  without JVD/Nodes/TM/ nl carotid upstrokes bilaterally   LUNGS: no acc muscle use,  Nl  contour chest which is clear to A and P bilaterally without cough on insp or exp maneuvers   CV:  RRR  no s3 or murmur or increase in P2, and no edema   ABD:  soft and nontender with nl inspiratory excursion in the supine position. No bruits or organomegaly appreciated, bowel sounds nl  MS:  Nl gait/ ext warm without deformities, calf tenderness, cyanosis or clubbing No obvious joint restrictions   SKIN: warm and dry without lesions    NEURO:  alert, approp, nl sensorium with  no motor or cerebellar deficits apparent.      I personally reviewed images and agree with radiology impression as follows:  CXR:   03/10/16     Normal heart size. Lungs are under aerated with bibasilar atelectasis. No pneumothorax or pleural effusion.  Labs ordered/ reviewed:      Chemistry      Component Value Date/Time   NA 138 04/20/2016 1524   K 3.6 04/20/2016 1524   CL 103 04/20/2016 1524   CO2 28 04/20/2016 1524   BUN 23 04/20/2016 1524   CREATININE 1.39 (H) 04/20/2016 1524      Component Value Date/Time   CALCIUM 9.7 04/20/2016 1524        Lab Results  Component Value Date   WBC 9.5 04/20/2016   HGB 11.6 (L) 04/20/2016   HCT 34.5 (L) 04/20/2016   MCV 91.4 04/20/2016   PLT 312.0 04/20/2016        Lab Results  Component Value Date   TSH 2.05 04/20/2016     Lab Results  Component Value Date   PROBNP 61.0 04/20/2016           Assessment:

## 2016-04-20 NOTE — Patient Instructions (Addendum)
Pantoprazole (protonix) 40 mg   Take  30-60 min before first meal of the day and Pepcid (famotidine)  20 mg one @  bedtime until return to office - this is the best way to tell whether stomach acid is contributing to your problem.    GERD (REFLUX)  is an extremely common cause of respiratory symptoms just like yours , many times with no obvious heartburn at all.    It can be treated with medication, but also with lifestyle changes including elevation of the head of your bed (ideally with 6 inch  bed blocks),  Smoking cessation, avoidance of late meals, excessive alcohol, and avoid fatty foods, chocolate, peppermint, colas, red wine, and acidic juices such as orange juice.  NO MINT OR MENTHOL PRODUCTS SO NO COUGH DROPS   USE SUGARLESS CANDY INSTEAD (Jolley ranchers or Stover's or Life Savers) or even ice chips will also do - the key is to swallow to prevent all throat clearing. NO OIL BASED VITAMINS - use powdered substitutes.    Weight control is simply a matter of calorie balance which needs to be tilted in your favor by eating less and exercising more.  To get the most out of exercise, you need to be continuously aware that you are short of breath, but never out of breath, for 30 minutes daily. As you improve, it will actually be easier for you to do the same amount of exercise  in  30 minutes so always push to the level where you are short of breath.    Please remember to go to the lab   department downstairs in the basement  for your tests - we will call you with the results when they are available.     Please schedule a follow up office visit in 6 weeks, call sooner if needed with full pfts on return

## 2016-04-21 LAB — RESPIRATORY ALLERGY PROFILE REGION II ~~LOC~~
Allergen, A. alternata, m6: <0.1 kU/L
Allergen, Cedar tree, t12: <0.1 kU/L
Allergen, Comm Silver Birch, t9: <0.1 kU/L
Allergen, Cottonwood, t14: <0.1 kU/L
Allergen, Mouse Urine Protein, e78: <0.1 kU/L
Allergen, Oak,t7: <0.1 kU/L
Box Elder IgE: <0.1 kU/L
Cat Dander: <0.1 kU/L
D. farinae: <0.1 kU/L
IgE (Immunoglobulin E), Serum: 20 kU/L (ref <115)
Johnson Grass: <0.1 kU/L
Rough Pigweed  IgE: <0.1 kU/L
Sheep Sorrel IgE: <0.1 kU/L

## 2016-04-21 NOTE — Assessment & Plan Note (Addendum)
04/20/2016  Walked RA x 2 laps @ 185 ft each stopped due to  Sob p 2 laps/ no cough or desat at fast pace  - Spirometry 04/20/2016  FEV1 2.49 (81 %)  Ratio 78 with truncated exp loop  Symptoms are markedly disproportionate to objective findings and not clear this is a lung problem but pt does appear to have difficult airway management issues. DDX of  difficult airways management almost all start with A and  include Adherence, Ace Inhibitors, Acid Reflux, Active Sinus Disease, Alpha 1 Antitripsin deficiency, Anxiety masquerading as Airways dz,  ABPA,  Allergy(esp in young), Aspiration (esp in elderly), Adverse effects of meds,  Active smokers, A bunch of PE's (a small clot burden can't cause this syndrome unless there is already severe underlying pulm or vascular dz with poor reserve) plus two Bs  = Bronchiectasis and Beta blocker use..and one C= CHF   Adherence is always the initial "prime suspect" and is a multilayered concern that requires a "trust but verify" approach in every patient - starting with knowing how to use medications, especially inhalers, correctly, keeping up with refills and understanding the fundamental difference between maintenance and prns vs those medications only taken for a very short course and then stopped and not refilled.   ? Acid (or non-acid) GERD > always difficult to exclude as up to 75% of pts in some series report no assoc GI/ Heartburn symptoms and she is s/p dilation which helped the dysphagia but of course then created a two way situation which likely worsened with wt gain and coughing > rec max (24h)  acid suppression and diet restrictions/ reviewed and instructions given in writing.   ? Allergy/asthma > rec allergy profile but try to avoid further pred if possible and no role for now for further asthma meds at this point  ? Anxiety > usually at the bottom of this list of usual suspects but should be much higher on this pt's based on H and P  Esp with day not noct  sense can't take a deep breath and not pt already on cymbalta  > Follow up per Primary Care planned    ? ACEi /ARB effect > note For reasons that may related to vascular permability and nitric oxide pathways but not elevated  bradykinin levels (as seen with  ACEi use) losartan in the generic form has been reported now from mulitple sources  to cause a similar pattern of non-specific  upper airway symptoms as seen with acei.   This has not been reported with exposure to the other ARB's to date, so it may be necessary to try  generic diovan or avapro if ARB needed or use an alternative class altogether.  See:  Dewayne HatchAnn Allergy Asthma Immunol  2008: 101: p [phone]   >  Follow up per Primary Care planned  ? Try alternative bp rx? eg bystolic or bisoprolol?   ? chf > excluded with bnp << 100  Total time devoted to counseling  > 50 % of initial 60 min office visit:  review case with pt/ discussion of options/alternatives/ personally creating written customized instructions  in presence of pt  then going over those specific  Instructions directly with the pt including how to use all of the meds but in particular covering each new medication in detail and the difference between the maintenance= "automatic" meds and the prns using an action plan format for the latter (If this problem/symptom => do that organization reading Left to  right).  Please see AVS from this visit for a full list of these instructions which I personally wrote for this pt and  are unique to this visit.

## 2016-04-21 NOTE — Assessment & Plan Note (Addendum)
Allergy profile 04/20/2016 >  Eos 0.2 /  IgE  Pending > consider referral back to allergy   Upper airway cough syndrome (previously labeled PNDS) , is  so named because it's frequently impossible to sort out how much is  CR/sinusitis with freq throat clearing (which can be related to primary GERD)   vs  causing  secondary (" extra esophageal")  GERD from wide swings in gastric pressure that occur with throat clearing, often  promoting self use of mint and menthol lozenges that reduce the lower esophageal sphincter tone and exacerbate the problem further in a cyclical fashion.   These are the same pts (now being labeled as having "irritable larynx syndrome" by some cough centers) who not infrequently have a history of having failed to tolerate ace inhibitors,  dry powder inhalers or biphosphonates or report having atypical/extraesophageal reflux symptoms that don't respond to standard doses of PPI  and are easily confused as having aecopd or asthma flares by even experienced allergists/ pulmonologists (myself included).   I don't see any evidence of asthma here so will hold off on adding back ICS and focus on rx of gerd/ cyclical coughing noting:  Of the three most common causes of  Sub-acute or recurrent or chronic cough, only one (GERD)  can actually contribute to/ trigger  the other two (asthma and post nasal drip syndrome)  and perpetuate the cylce of cough.  While not intuitively obvious, many patients with chronic low grade reflux do not cough until there is a primary insult that disturbs the protective epithelial barrier and exposes sensitive nerve endings.   This is typically viral as likely the scenario here with flares with uri's and "pna" by her hx  but can be direct physical injury such as with an endotracheal tube.   The point is that once this occurs, it is difficult to eliminate the cycle  using anything but a maximally effective acid suppression regimen at least in the short run, accompanied  by an appropriate diet to address non acid GERD and control / eliminate the cough itself for at least 3 days.

## 2016-04-24 NOTE — Progress Notes (Signed)
lmtcb

## 2016-04-25 NOTE — Progress Notes (Signed)
LMTCB

## 2016-04-27 ENCOUNTER — Telehealth: Payer: Self-pay | Admitting: Internal Medicine

## 2016-04-27 NOTE — Telephone Encounter (Signed)
Notes Recorded by Nyoka CowdenMichael B Wert, MD on 04/22/2016 at 6:36 AM EST Call patient : Studies are unremarkable, no change in recs  Called and spoke to pt. Informed her of the results and recs per MW. Pt verbalized understanding and denied any further questions or concerns at this time.

## 2016-04-27 NOTE — Progress Notes (Signed)
Called and spoke to pt. Informed her of the results and recs per MW. Pt verbalized understanding and denied any further questions or concerns at this time.  ° °

## 2016-06-01 ENCOUNTER — Ambulatory Visit: Payer: BLUE CROSS/BLUE SHIELD | Admitting: Internal Medicine

## 2016-10-02 ENCOUNTER — Other Ambulatory Visit: Payer: Self-pay | Admitting: Internal Medicine

## 2017-02-13 DIAGNOSIS — Z8616 Personal history of COVID-19: Secondary | ICD-10-CM

## 2017-02-13 HISTORY — DX: Personal history of COVID-19: Z86.16

## 2017-07-10 ENCOUNTER — Ambulatory Visit
Admission: RE | Admit: 2017-07-10 | Discharge: 2017-07-10 | Disposition: A | Discharge status: Home / Self Care | Payer: BLUE CROSS/BLUE SHIELD | Source: Ambulatory Visit | Attending: Family Medicine | Admitting: Family Medicine

## 2017-07-10 ENCOUNTER — Other Ambulatory Visit: Payer: Self-pay | Admitting: Family Medicine

## 2017-07-10 DIAGNOSIS — R05 Cough: Secondary | ICD-10-CM

## 2017-07-10 DIAGNOSIS — R059 Cough, unspecified: Secondary | ICD-10-CM

## 2017-08-03 ENCOUNTER — Ambulatory Visit: Payer: BLUE CROSS/BLUE SHIELD | Admitting: Internal Medicine

## 2017-08-03 ENCOUNTER — Encounter: Payer: Self-pay | Admitting: Internal Medicine

## 2017-08-03 VITALS — BP 144/80 | HR 82 | Ht 67.25 in | Wt 224.0 lb

## 2017-08-03 DIAGNOSIS — R0609 Other forms of dyspnea: Secondary | ICD-10-CM

## 2017-08-03 DIAGNOSIS — R0600 Dyspnea, unspecified: Secondary | ICD-10-CM

## 2017-08-03 DIAGNOSIS — R058 Other specified cough: Secondary | ICD-10-CM

## 2017-08-03 DIAGNOSIS — R05 Cough: Secondary | ICD-10-CM

## 2017-08-03 MED ORDER — ACETAMINOPHEN-CODEINE #3 300-30 MG PO TABS: 1.0000 | ORAL_TABLET | ORAL | 0 refills | Status: DC | PRN

## 2017-08-03 NOTE — Progress Notes (Signed)
Subjective:     Patient ID: Heidi Holt, female   DOB: 1960-08-24,    MRN: [id number]    Brief patient profile:  27 yowf never smoker with spring >> fall   itchy eye/ runny nose/ sneeze cough /sob all her life eval by [facility] pos tree pollen and rx allergy shots in her 30's and seasonal flares maybe 50 % better and stayed the same pattern where  100% better between flares and just controlled with flonase/ zyrtec then worse since 2015 with dx  pna and since then persistent coughing that disturbs sleep and  worse with activity so referred to pulmonary clinic 04/20/2016 by Dr   Azucena Cecil p unsatisfactory eval by [facility] ?  Why?  Has GERD/s/p dilation 09/07/15 with rec to stay on ppi lifetime per Dr Leone Payor    History of Present Illness  04/20/2016 1st [facility] office visit/ [name]  maint on ppi 3 h ac  Chief Complaint  Patient presents with  . Pulmonary Consult    Referred by Dr. Tally Joe. Pt c/o DOE since PNA end of Nov 2017. She reports she gets out of breath with any exertion, even walking across the room or singing. She also c/o cough with white to yellow sputum. Cough occ wakes her in the night.   prior to 2015 could even jog at wt 165 but since then  on freq pred courses for "bronchitis" and gained 50 lb over 2-3 y Much worse sob since  Nov 2017  " pna " symbicort helped some but out since Jan and ? slt worse breathing since off but no change in cough  Has constant sensation while awake that can't take a deep breath even at rest assoc with dry cough day > noct and doe across the room  Cough worse with voice use esp singing rec Pantoprazole (protonix) 40 mg   Take  30-60 min before first meal of the day and Pepcid (famotidine)  20 mg one @  bedtime until return to office - this is the best way to tell whether stomach acid is contributing to your problem.   GERD diet   Weight control is simply a matter of calorie balance which needs to be tilted in your favor by eating less  and exercising more.  To get the most out of exercise, you need to be continuously aware that you are short of breath, but never out of breath, for 30 minutes daily. As you improve, it will actually be easier for you to do the same amount of exercise  in  30 minutes so always push to the level where you are short of breath.   Please schedule a follow up office visit in 6 weeks, call sooner if needed with full pfts on return > did not do    08/03/2017 acute extended ov/[name] re: uacs severe since April 2019 Chief Complaint  Patient presents with  . Acute Visit    Cough since first of April 2019.  She is coughing until she vomits. Cough is usually non prod but occ produces "cloudy" sputum. She states cough keeps her up all night.  She has been using proair 5 x per wk on average.   improved 50% from prior ov daily symptoms of  Doe/chest tightness  x 3.5 mph at 3-4 degree grade x 8-9 minutes  Then around 1st of April 2019 abrupt onset cough 24/7 min productive rx by pcp amox/ pred/ inhalers then zpak/prednisone / cough med > ranked  all as zero per cent better. Using lots of halls cough drops listed as forbidden at last ove  Now when tries to exert also starts severe cough / urinary incont   No obvious day to day or daytime variability or assoc excess/ purulent sputum or mucus plugs or hemoptysis or cp or chest tightness, subjective wheeze or overt sinus or hb symptoms. No unusual exposure hx or h/o childhood pna/ asthma or knowledge of premature birth.   Also denies any obvious fluctuation of symptoms with weather or environmental changes or other aggravating or alleviating factors except as outlined above   Current Allergies, Complete Past Medical History, Past Surgical History, Family History, and Social History were reviewed in [facility].  ROS  The following are not active complaints unless bolded Hoarseness, sore throat, dysphagia, dental problems, itching,  sneezing,  nasal congestion or discharge of excess mucus or purulent secretions, ear ache,   fever, chills, sweats, unintended wt loss or wt gain, classically pleuritic or exertional cp,  orthopnea pnd or arm/hand swelling  or leg swelling, presyncope, palpitations, abdominal pain, anorexia, nausea, vomiting, diarrhea  or change in bowel habits or change in bladder habits, change in stools or change in urine, dysuria, hematuria,  rash, arthralgias, visual complaints, headache, numbness, weakness or ataxia or problems with walking or coordination,  change in mood or  memory.        Current Meds  Medication Sig  . citalopram (CELEXA) 40 MG tablet Take 40 mg by mouth daily.  . famotidine (PEPCID) 20 MG tablet One at bedtime  . ibuprofen (ADVIL,MOTRIN) 200 MG tablet Take 200 mg by mouth as needed.  Marland Kitchen. losartan-hydrochlorothiazide (HYZAAR) 100-25 MG tablet Take 1 tablet by mouth daily.  . pantoprazole (PROTONIX) 40 MG tablet TAKE ONE TABLET BY MOUTH BEFORE BREAKFAST  . [DISCONTINUED] albuterol (PROAIR HFA) 108 (90 Base) MCG/ACT inhaler Inhale 2 puffs into the lungs every 6 (six) hours as needed for wheezing or shortness of breath.              Objective:   Physical Exam     08/03/2017        224   04/20/16 227 lb (103 kg)  09/07/15 213 lb (96.6 kg)  08/31/15 213 lb (96.6 kg)    Vital signs reviewed - Note on arrival 02 sats  99% on RA    amb obese wf freq throat clearing   HEENT: nl dentition, turbinates bilaterally, and oropharynx. Nl external ear canals without cough reflex   NECK :  without JVD/Nodes/TM/ nl carotid upstrokes bilaterally   LUNGS: no acc muscle use,  Nl contour chest which is clear to A and P bilaterally with  cough on insp     CV:  RRR  no s3 or murmur or increase in P2, and no edema   ABD:  soft and nontender with nl inspiratory excursion in the supine position. No bruits or organomegaly appreciated, bowel sounds nl  MS:  Nl gait/ ext warm without deformities,  calf tenderness, cyanosis or clubbing No obvious joint restrictions   SKIN: warm and dry without lesions    NEURO:  alert, approp, nl sensorium with  no motor or cerebellar deficits apparent.          CXR PA and Lateral:   08/03/2017 :    I personally reviewed images and agree with radiology impression as follows:   No definite active process.  Cannot exclude bronchitis.  Assessment:

## 2017-08-03 NOTE — Assessment & Plan Note (Addendum)
-   Spirometry 04/20/2016  FEV1 2.49 (81 %)  Ratio 78 with truncated exp loop   - Allergy profile 04/20/16  >  Eos 0.2 /  IgE  20 RAST neg  - 04/20/16 rec max gerd rx > resolved - 05/14/17 recurred >> 08/03/2017 cyclical cough rx     Of the three most common causes of  Sub-acute / recurrent or chronic cough, only one (GERD, which we know she has)  can actually contribute to/ trigger  the other two (asthma and post nasal drip syndrome)  and perpetuate the cylce of cough.  While not intuitively obvious, many patients with chronic low grade reflux do not cough until there is a primary insult that disturbs the protective epithelial barrier and exposes sensitive nerve endings.   This is typically viral but can due to PNDS and  either may apply here.   The point is that once this occurs, it is difficult to eliminate the cycle  using anything but a maximally effective acid suppression regimen at least in the short run, accompanied by an appropriate diet to address non acid GERD and control / eliminate the cough itself for at least 3 days with tyl #3   Next step may be to add gabapentin to rx   F/u with pfts in  4 weeks   I had an extended discussion with the patient reviewing all relevant studies completed to date and  lasting 15 to 20 minutes of a 25 minute visit    Each maintenance medication was reviewed in detail including most importantly the difference between maintenance and prns and under what circumstances the prns are to be triggered using an action plan format that is not reflected in the computer generated alphabetically organized AVS.    Please see AVS for specific instructions unique to this visit that I personally wrote and verbalized to the the pt in detail and then reviewed with pt  by my nurse highlighting any  changes in therapy recommended at today's visit to their plan of care.

## 2017-08-03 NOTE — Patient Instructions (Addendum)
Pantoprazole (protonix) 40 mg   Take  30-60 min before first meal of the day and Pepcid (famotidine)  20 mg one @  bedtime until return to office - this is the best way to tell whether stomach acid is contributing to your problem.    For drainage / throat tickle try take CHLORPHENIRAMINE  4 mg - take one every 4 hours as needed - available over the counter- may cause drowsiness so start with just a bedtime dose or two and see how you tolerate it before trying in daytime    Bedtime really means one hour before   GERD (REFLUX)  is an extremely common cause of respiratory symptoms just like yours , many times with no obvious heartburn at all.    It can be treated with medication, but also with lifestyle changes including elevation of the head of your bed (ideally with 6 inch  bed blocks),  Smoking cessation, avoidance of late meals, excessive alcohol, and avoid fatty foods, chocolate, peppermint, colas, red wine, and acidic juices such as orange juice.  NO MINT OR MENTHOL PRODUCTS SO NO COUGH DROPS   USE SUGARLESS CANDY INSTEAD (Jolley ranchers or Stover's or Life Savers) or even ice chips will also do - the key is to swallow to prevent all throat clearing. NO OIL BASED VITAMINS - use powdered substitutes.    Once cough is gone,  To get the most out of exercise, you need to be continuously aware that you are short of breath, but never out of breath, for 30 minutes daily. As you improve, it will actually be easier for you to do the same amount of exercise  in  30 minutes so always push to the level where you are short of breath.     Take delsym two tsp every 12 hours and supplement if needed with  Tylenol #3  up to 1- 2 every 4 hours to suppress the urge to cough. Swallowing water and/or using ice chips/non mint and menthol containing candies (such as lifesavers or sugarless jolly ranchers) are also effective.  You should rest your voice and avoid activities that you know make you cough.  Once you  have eliminated the cough for 3 straight days try reducing the tylenol #3 first,  then the delsym as tolerated.      Please schedule a follow up office visit in 4 weeks, call sooner if needed with full pfts on return

## 2017-08-05 ENCOUNTER — Encounter: Payer: Self-pay | Admitting: Internal Medicine

## 2017-08-05 NOTE — Assessment & Plan Note (Addendum)
04/20/2016  Walked RA x 2 laps @ 185 ft each stopped due to  Sob p 2 laps/ no cough or desat at fast pace  - Spirometry 04/20/2016  FEV1 2.49 (81 %)  Ratio 78 with truncated exp loop   - Allergy profile 04/20/16  >  Eos 0.2 /  IgE  20 RAST neg   Strongly suspect deconditioning here >  see avs for instructions unique to this ov re re-conditioning and pfts on return then ? cpst if not improving

## 2017-09-07 ENCOUNTER — Ambulatory Visit (INDEPENDENT_AMBULATORY_CARE_PROVIDER_SITE_OTHER): Payer: BLUE CROSS/BLUE SHIELD | Admitting: Internal Medicine

## 2017-09-07 ENCOUNTER — Encounter: Payer: Self-pay | Admitting: Internal Medicine

## 2017-09-07 ENCOUNTER — Ambulatory Visit: Payer: BLUE CROSS/BLUE SHIELD | Admitting: Internal Medicine

## 2017-09-07 VITALS — BP 132/84 | HR 78 | Ht 68.0 in | Wt 227.0 lb

## 2017-09-07 DIAGNOSIS — R058 Other specified cough: Secondary | ICD-10-CM

## 2017-09-07 DIAGNOSIS — R05 Cough: Secondary | ICD-10-CM | POA: Diagnosis not present

## 2017-09-07 DIAGNOSIS — R0600 Dyspnea, unspecified: Secondary | ICD-10-CM

## 2017-09-07 DIAGNOSIS — I10 Essential (primary) hypertension: Secondary | ICD-10-CM | POA: Diagnosis not present

## 2017-09-07 DIAGNOSIS — R0609 Other forms of dyspnea: Secondary | ICD-10-CM

## 2017-09-07 LAB — PULMONARY FUNCTION TEST
DL/VA % PRED: 77 %
DL/VA: 4.07 ml/min/mmHg/L
DLCO unc % pred: 80 %
DLCO unc: 23.79 ml/min/mmHg
FEF 25-75 Post: 2.52 L/sec
FEF 25-75 Pre: 3.19 L/sec
FEF2575-%Change-Post: -21 %
FEF2575-%Pred-Post: 92 %
FEF2575-%Pred-Pre: 117 %
FEV1-%Change-Post: -5 %
FEV1-%PRED-POST: 86 %
FEV1-%Pred-Pre: 91 %
FEV1-PRE: 2.78 L
FEV1-Post: 2.63 L
FEV1FVC-%Change-Post: 1 %
FEV1FVC-%Pred-Pre: 105 %
FEV6-%Change-Post: -6 %
FEV6-%PRED-POST: 82 %
FEV6-%PRED-PRE: 88 %
FEV6-POST: 3.11 L
FEV6-Pre: 3.32 L
FEV6FVC-%Change-Post: 0 %
FEV6FVC-%PRED-POST: 103 %
FEV6FVC-%Pred-Pre: 102 %
FVC-%CHANGE-POST: -6 %
FVC-%PRED-PRE: 85 %
FVC-%Pred-Post: 79 %
FVC-POST: 3.11 L
FVC-PRE: 3.34 L
POST FEV6/FVC RATIO: 100 %
Post FEV1/FVC ratio: 85 %
Pre FEV1/FVC ratio: 83 %
Pre FEV6/FVC Ratio: 99 %
RV % pred: 54 %
RV: 1.17 L
TLC % PRED: 84 %
TLC: 4.79 L

## 2017-09-07 MED ORDER — NEBIVOLOL HCL 10 MG PO TABS: 10.0000 mg | ORAL_TABLET | Freq: Every day | ORAL | 0 refills | Status: DC

## 2017-09-07 NOTE — Progress Notes (Addendum)
Subjective:     Patient ID: Heidi Holt, female   DOB: 1960/11/27,    MRN: [id number]    Brief patient profile:  35 yowf never smoker with spring >> fall   itchy eye/ runny nose/ sneeze cough /sob all her life eval by [facility] pos tree pollen and rx allergy shots in her 30's and seasonal flares maybe 50 % better and stayed the same pattern where  100% better between flares and just controlled with flonase/ zyrtec then worse since 2015 with dx  pna and since then persistent coughing that disturbs sleep and  worse with activity so referred to pulmonary clinic 04/20/2016 by Dr   Azucena Cecil p unsatisfactory eval by [facility] ?  Why?  Has GERD/s/p dilation 09/07/15 with rec to stay on ppi lifetime per Dr Leone Payor    History of Present Illness  04/20/2016 1st [facility] office visit/ Heidi Holt  maint on ppi 3 h ac  Chief Complaint  Patient presents with  . Pulmonary Consult    Referred by Dr. Tally Joe. Pt c/o DOE since PNA end of Nov 2017. She reports she gets out of breath with any exertion, even walking across the room or singing. She also c/o cough with white to yellow sputum. Cough occ wakes her in the night.   prior to 2015 could even jog at wt 165 but since then  on freq pred courses for "bronchitis" and gained 50 lb over 2-3 y Much worse sob since  Nov 2017  " pna " symbicort helped some but out since Jan and ? slt worse breathing since off but no change in cough  Has constant sensation while awake that can't take a deep breath even at rest assoc with dry cough day > noct and doe across the room  Cough worse with voice use esp singing rec Pantoprazole (protonix) 40 mg   Take  30-60 min before first meal of the day and Pepcid (famotidine)  20 mg one @  bedtime until return to office - this is the best way to tell whether stomach acid is contributing to your problem.   GERD diet   Weight control is simply a matter of calorie balance which needs to be tilted in your favor by eating less  and exercising more.  To get the most out of exercise, you need to be continuously aware that you are short of breath, but never out of breath, for 30 minutes daily. As you improve, it will actually be easier for you to do the same amount of exercise  in  30 minutes so always push to the level where you are short of breath.   Please schedule a follow up office visit in 6 weeks, call sooner if needed with full pfts on return > did not do    08/03/2017 acute extended ov/Heidi Holt re: uacs severe since April 2019 Chief Complaint  Patient presents with  . Acute Visit    Cough since first of April 2019.  She is coughing until she vomits. Cough is usually non prod but occ produces "cloudy" sputum. She states cough keeps her up all night.  She has been using proair 5 x per wk on average.   improved 50% from prior ov daily symptoms of  Doe/chest tightness  x 3.5 mph at 3-4 degree grade x 8-9 minutes Then around 1st of April 2019 abrupt onset cough 24/7 min productive rx by pcp amox/ pred/ inhalers then zpak/prednisone / cough med > ranked all  as zero per cent better. Using lots of halls cough drops listed as forbidden at last ov Now when tries to exert also starts severe cough / urinary incont rec Pantoprazole (protonix) 40 mg   Take  30-60 min before first meal of the day and Pepcid (famotidine)  20 mg one @  bedtime until return to office - this is the best way to tell whether stomach acid is contributing to your problem.   For drainage / throat tickle try take CHLORPHENIRAMINE  4 mg - take one every 4 hours as needed - available over the counter- may cause drowsiness so start with just a bedtime dose or two and see how you tolerate it before trying in daytime   Bedtime really means one hour before  GERD  Diet   Once cough is gone,  To get the most out of exercise, you need to be continuously aware that you are short of breath, but never out of breath, for 30 minutes daily. As you improve, it will actually be  easier for you to do the same amount of exercise  in  30 minutes so always push to the level where you are short of breath.   Take delsym two tsp every 12 hours and supplement if needed with  Tylenol #3  up to 1- 2 every 4 hours  Once you have eliminated the cough for 3 straight days try reducing the tylenol #3 first,  then the delsym as tolerated.      09/07/2017  f/u ov/Heidi Holt re: uacs since 2015 worse since April 2019/ did not follow above instructions (no H1 Chief Complaint  Patient presents with  . Follow-up    PFT's done today. Cough is unchanged. She occ produces some "cloudy" sputum.    Dyspnea:  Can't do treadmill due to sob p 50 ft (not reproduced at ov)  Hs symptoms 4-5 nocts/week w/in a few minutes = dry coughing fits  Also with voice use  Cough much worse/ no better with saba / overt hb despite reporting compliance with diet/ ppi and h2 hs   No obvious day to day or daytime variability or assoc excess/ purulent sputum or mucus plugs or hemoptysis or cp or chest tightness, subjective wheeze      . Also denies any obvious fluctuation of symptoms with weather or environmental changes or other aggravating or alleviating factors except as outlined above   No unusual exposure hx or h/o childhood pna/ asthma or knowledge of premature birth.  Current Allergies, Complete Past Medical History, Past Surgical History, Family History, and Social History were reviewed in [facility] electronic medical record.  ROS  The following are not active complaints unless bolded Hoarseness, sore throat, dysphagia, dental problems, itching, sneezing,  nasal congestion or discharge of excess mucus or purulent secretions, ear ache,   fever, chills, sweats, unintended wt loss or wt gain, classically pleuritic or exertional cp,  orthopnea pnd or arm/hand swelling  or leg swelling, presyncope, palpitations, abdominal pain, anorexia, nausea, vomiting, diarrhea  or change in bowel habits or change in bladder  habits, change in stools or change in urine, dysuria, hematuria,  rash, arthralgias, visual complaints, headache, numbness, weakness or ataxia or problems with walking or coordination,  change in mood or  memory.        Current Meds  Medication Sig  . citalopram (CELEXA) 40 MG tablet Take 40 mg by mouth daily.  . famotidine (PEPCID) 20 MG tablet One at bedtime  . ibuprofen (  ADVIL,MOTRIN) 200 MG tablet Take 200 mg by mouth as needed.  . pantoprazole (PROTONIX) 40 MG tablet TAKE ONE TABLET BY MOUTH BEFORE BREAKFAST  .     . [  losartan-hydrochlorothiazide (HYZAAR) 100-25 MG tablet Take 1 tablet by mouth daily.               Objective:   Physical Exam  Amb obese wf / hopeless affect/ did not cough entire interview, just with walking     08/03/2017        224   04/20/16 227 lb (103 kg)  09/07/15 213 lb (96.6 kg)  08/31/15 213 lb (96.6 kg)    Vital signs reviewed - Note on arrival 02 sats  100% on RA  HEENT: nl dentition, turbinates bilaterally, and oropharynx which is pristine. Nl external ear canals without cough reflex   NECK :  without JVD/Nodes/TM/ nl carotid upstrokes bilaterally   LUNGS: no acc muscle use,  Nl contour chest which is clear to A and P bilaterally without cough on insp or exp maneuvers   CV:  RRR  no s3 or murmur or increase in P2, and no edema   ABD:  soft and nontender with nl inspiratory excursion in the supine position. No bruits or organomegaly appreciated, bowel sounds nl  MS:  Nl gait/ ext warm without deformities, calf tenderness, cyanosis or clubbing No obvious joint restrictions   SKIN: warm and dry without lesions    NEURO:  alert,nl sensorium with  no motor or cerebellar deficits apparent.                   Assessment:

## 2017-09-07 NOTE — Patient Instructions (Addendum)
Stop losartan  Start Bystolic 10 mg daily - take twice daily  - if cough resolves can cancel the asthma challenge test    For drainage / throat tickle try take CHLORPHENIRAMINE  4 mg - take one every 4 hours as needed - available over the counter- may cause drowsiness so start with just a bedtime dose or two (one hour before bedtime)  and see how you tolerate it before trying in daytime    Please see patient coordinator before you leave today  to schedule methacholine challenge testing the day of your next visit  Please schedule a follow up office visit in 2 weeks, sooner if needed  with all medications /inhalers/ solutions in hand so we can verify exactly what you are taking. This includes all medications from all doctors and over the counters

## 2017-09-07 NOTE — Progress Notes (Signed)
PFT done today. 

## 2017-09-08 ENCOUNTER — Encounter: Payer: Self-pay | Admitting: Internal Medicine

## 2017-09-08 DIAGNOSIS — I10 Essential (primary) hypertension: Secondary | ICD-10-CM | POA: Insufficient documentation

## 2017-09-08 NOTE — Assessment & Plan Note (Signed)
For reasons that may related to vascular permability and nitric oxide pathways but not elevated  bradykinin levels (as seen with  ACEi use) losartan in the generic form has been reported now from mulitple sources  to cause a similar pattern of non-specific  upper airway symptoms as seen with acei.   This has not been reported with exposure to the other ARB's to date, so it seems reasonable for now to try either generic diovan or avapro if ARB needed or use an alternative class altogether.  See:  Dewayne HatchAnn Allergy Asthma Immunol  2008: 101: p 495-499    In the setting of respiratory symptoms of unknown etiology,  It would be preferable to use bystolic, the most beta -1  selective Beta blocker available in sample form, with bisoprolol the most selective generic choice  on the market, at least on a trial basis, to make sure the spillover Beta 2 effects of the less specific Beta blockers are not contributing to this patient's symptoms.   rec bystolic 10 mg daily samples x one month and if no better then change back to losartan

## 2017-09-08 NOTE — Assessment & Plan Note (Signed)
04/20/2016  Walked RA x 2 laps @ 185 ft each stopped due to  Sob p 2 laps/ no cough or desat at fast pace  - Spirometry 04/20/2016  FEV1 2.49 (81 %)  Ratio 78 with truncated exp loop   - Allergy profile 04/20/16  >  Eos 0.2 /  IgE  20 RAST neg  - PFT's  09/07/2017  wnl  - 09/07/2017  Walked RA x 3 laps @ 185 ft each stopped due to  End of study, fast  pace, no sob or desat     Unable to reproduce her chronic complaint "can't walk 50 ft s feeling short of breath" in office and again rec regular paced exercise but feel it's extremely unlikely she has an active/ ongoing cardiac or pulmonary problem at this point and strongly suspect this is all uacs  (see separate a/p)

## 2017-09-08 NOTE — Assessment & Plan Note (Addendum)
-   Spirometry 04/20/2016  FEV1 2.49 (81 %)  Ratio 78 with truncated exp loop   - Allergy profile 04/20/16  >  Eos 0.2 /  IgE  20 RAST neg  - 04/20/16 rec max gerd rx > resolved - 05/14/17 recurred >> 08/03/2017 cyclical cough rx >  did not follow all instructions  - 09/07/2017 try off losartan x one month - Methacholine challenge testing 09/07/2017    I had an extended final summary discussion with the patient reviewing all relevant studies completed to date and  lasting 15 to 20 minutes of a 25 minute visit on the following issues:   1) no evidence of a chronic pulmonary problem here though she could still have asthma and need MCT to r/o  2) mostly likely this is irritable larynx syndrome assoc with sense of pnds and first step as prev rec is to add  1st gen H1 blockers per guidelines  At hs to see what if any effect this has on cough at hs and if better add during the day if tol and if not refer to [facility]/ Dr Delford FieldWright   3) try x one month off losartan though doubt it's the problem - see hbp  4) Each maintenance medication was reviewed in detail including most importantly the difference between maintenance and as needed and under what circumstances the prns are to be used.  Please see AVS for specific  Instructions which are unique to this visit and I personally typed out  which were reviewed in detail in writing with the patient and a copy provided.

## 2017-09-27 ENCOUNTER — Encounter (HOSPITAL_COMMUNITY): Payer: BLUE CROSS/BLUE SHIELD

## 2017-10-04 ENCOUNTER — Ambulatory Visit: Payer: BLUE CROSS/BLUE SHIELD | Admitting: Internal Medicine

## 2017-12-01 ENCOUNTER — Other Ambulatory Visit: Payer: Self-pay | Admitting: Internal Medicine

## 2017-12-03 NOTE — Telephone Encounter (Signed)
Needs ov with all meds in hand 

## 2017-12-03 NOTE — Telephone Encounter (Signed)
MW please advise on refill. Thanks. 

## 2017-12-05 ENCOUNTER — Other Ambulatory Visit: Payer: Self-pay | Admitting: Internal Medicine

## 2018-03-16 ENCOUNTER — Other Ambulatory Visit: Payer: Self-pay | Admitting: Internal Medicine

## 2018-07-22 ENCOUNTER — Other Ambulatory Visit: Payer: Self-pay | Admitting: Internal Medicine

## 2018-08-09 ENCOUNTER — Other Ambulatory Visit: Payer: Self-pay | Admitting: Internal Medicine

## 2018-12-17 ENCOUNTER — Other Ambulatory Visit: Payer: Self-pay

## 2018-12-17 DIAGNOSIS — Z20822 Contact with and (suspected) exposure to covid-19: Secondary | ICD-10-CM

## 2018-12-18 ENCOUNTER — Other Ambulatory Visit: Payer: Self-pay

## 2018-12-18 LAB — NOVEL CORONAVIRUS, NAA: SARS-CoV-2, NAA: DETECTED — AB

## 2019-09-09 ENCOUNTER — Ambulatory Visit: Payer: Self-pay | Admitting: Neurology

## 2020-02-19 ENCOUNTER — Encounter: Payer: Self-pay | Admitting: *Deleted

## 2020-02-20 ENCOUNTER — Other Ambulatory Visit: Payer: Self-pay

## 2020-02-20 ENCOUNTER — Encounter: Payer: Self-pay | Admitting: Neurology

## 2020-02-20 ENCOUNTER — Ambulatory Visit (INDEPENDENT_AMBULATORY_CARE_PROVIDER_SITE_OTHER): Payer: 59 | Admitting: Neurology

## 2020-02-20 DIAGNOSIS — G43019 Migraine without aura, intractable, without status migrainosus: Secondary | ICD-10-CM | POA: Diagnosis not present

## 2020-02-20 HISTORY — DX: Migraine without aura, intractable, without status migrainosus: G43.019

## 2020-02-20 MED ORDER — RIZATRIPTAN BENZOATE 10 MG PO TABS: 10.0000 mg | ORAL_TABLET | Freq: Three times a day (TID) | ORAL | 3 refills | Status: AC | PRN

## 2020-02-20 MED ORDER — TOPIRAMATE 25 MG PO TABS: ORAL_TABLET | ORAL | 3 refills | Status: DC
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
## 2020-02-20 NOTE — Patient Instructions (Signed)
We will start Topamax for migraine prevention.  Use the Maxalt 10 mg tablet if needed for the headache.  Topamax (topiramate) is a seizure medication that has an FDA approval for seizures and for migraine headache. Potential side effects of this medication include weight loss, cognitive slowing, tingling in the fingers and toes, and carbonated drinks will taste bad. If any significant side effects are noted on this drug, please contact our office.

## 2020-03-10 ENCOUNTER — Telehealth: Payer: Self-pay | Admitting: Neurology

## 2020-03-10 MED ORDER — ZONISAMIDE 100 MG PO CAPS: 100.0000 mg | ORAL_CAPSULE | Freq: Every day | ORAL | 1 refills | Status: DC

## 2020-03-10 NOTE — Telephone Encounter (Signed)
I called the patient.  The patient is on Topamax 50 mg at night, she cannot take 75 mg at night as it causes significant stomach upset, she has a history of gastroesophageal reflux disease.  Even at 50 mg dosing, she does not feel well on the medication.  We will stop the drug and switch over to Zonegran taking 100 mg at night.

## 2020-03-10 NOTE — Addendum Note (Signed)
Addended by: York Spaniel on: 03/10/2020 06:30 PM   Modules accepted: Orders

## 2020-03-10 NOTE — Telephone Encounter (Signed)
Pt. states she cannot take topiramate (TOPAMAX) 25 MG tablet as it is tearing her stomach up. Please advise.

## 2020-03-10 NOTE — Telephone Encounter (Signed)
Patient stated she developed n/v/d with taking 3 tablets twice a day.  Feels she could probably tolerate the 2 tablets twice a day and is going to go back to 2 tablets until she hears from you.  She stated that it did help her headache somewhat but didn't resolve it completely, however she mentioned she wasn't sure she still had a headache because her stomach was so upset taking the 3 tablets twice daily.

## 2020-05-20 ENCOUNTER — Ambulatory Visit: Payer: 59 | Admitting: Neurology

## 2020-08-04 ENCOUNTER — Ambulatory Visit: Payer: 59 | Admitting: Neurology

## 2020-09-08 ENCOUNTER — Telehealth: Payer: Self-pay | Admitting: Neurology

## 2020-09-08 NOTE — Telephone Encounter (Signed)
OV cancelled due to Maralyn Sago out, LVM/mychart msg sent informing pt.

## 2020-09-09 ENCOUNTER — Ambulatory Visit: Payer: 59 | Admitting: Neurology

## 2021-06-23 ENCOUNTER — Encounter: Payer: Self-pay | Admitting: Interventional Cardiology

## 2021-06-23 ENCOUNTER — Ambulatory Visit (INDEPENDENT_AMBULATORY_CARE_PROVIDER_SITE_OTHER): Payer: 59 | Admitting: Interventional Cardiology

## 2021-06-23 VITALS — BP 136/78 | HR 72 | Ht 66.5 in | Wt 205.4 lb

## 2021-06-23 DIAGNOSIS — I10 Essential (primary) hypertension: Secondary | ICD-10-CM | POA: Diagnosis not present

## 2021-06-23 DIAGNOSIS — E782 Mixed hyperlipidemia: Secondary | ICD-10-CM

## 2021-06-23 DIAGNOSIS — R0609 Other forms of dyspnea: Secondary | ICD-10-CM

## 2021-06-23 DIAGNOSIS — R7989 Other specified abnormal findings of blood chemistry: Secondary | ICD-10-CM

## 2021-06-23 DIAGNOSIS — Z8249 Family history of ischemic heart disease and other diseases of the circulatory system: Secondary | ICD-10-CM | POA: Diagnosis not present

## 2021-06-23 MED ORDER — METOPROLOL TARTRATE 100 MG PO TABS: ORAL_TABLET | ORAL | 0 refills | Status: DC

## 2021-06-23 NOTE — Patient Instructions (Signed)
Medication Instructions:  ?Your physician recommends that you continue on your current medications as directed. Please refer to the Current Medication list given to you today. ? ?*If you need a refill on your cardiac medications before your next appointment, please call your pharmacy* ? ? ?Lab Work: ?Lab work to be done today--BMP ?If you have labs (blood work) drawn today and your tests are completely normal, you will receive your results only by: ?MyChart Message (if you have MyChart) OR ?A paper copy in the mail ?If you have any lab test that is abnormal or we need to change your treatment, we will call you to review the results. ? ? ?Testing/Procedures: ?Your physician has requested that you have an echocardiogram. Echocardiography is a painless test that uses sound waves to create images of your heart. It provides your doctor with information about the size and shape of your heart and how well your heart?s chambers and valves are working. This procedure takes approximately one hour. There are no restrictions for this procedure. ? ?Your physician has requested that you have cardiac CT. Cardiac computed tomography (CT) is a painless test that uses an x-ray machine to take clear, detailed pictures of your heart. For further information please visit [email or web address] Please follow instruction sheet as given. ? ? ? ? ?Follow-Up: ?At [facility], you and your health needs are our priority.  As part of our continuing mission to provide you with exceptional heart care, we have created designated Provider Care Teams.  These Care Teams include your primary Cardiologist (physician) and Advanced Practice Providers (APPs -  Physician Assistants and Nurse Practitioners) who all work together to provide you with the care you need, when you need it. ? ?We recommend signing up for the patient portal called "MyChart".  Sign up information is provided on this After Visit Summary.  MyChart is used to connect with patients  for Virtual Visits (Telemedicine).  Patients are able to view lab/test results, encounter notes, upcoming appointments, etc.  Non-urgent messages can be sent to your provider as well.   ?To learn more about what you can do with MyChart, go to [email or web address].   ? ?Your next appointment:   ?Based on test results ? ?The format for your next appointment:   ?In Person ? ?Provider:   ?Lance Muss, MD   ? ? ?Other Instructions ? ? ?Your cardiac CT will be scheduled at one of the below locations:  ? ?[facility] ?[address] ?[place], [place] [address] ?[phone] ? ?OR ? ?[facility] ?[address] ?[address] ?[place], [place] [address] ?[phone] ? ?If scheduled at [facility], please arrive at the [facility] (Entrance C2) of [facility] 30 minutes prior to test start time. ?You can use the FREE valet parking offered at entrance C (encouraged to control the heart rate for the test)  ?Proceed to the [facility] Radiology Department (first floor) to check-in and test prep. ? ?All radiology patients and guests should use entrance C2 at [facility], accessed from [facility], even though the hospital's physical address listed is [address]. ? ? ? ?If scheduled at [facility], please arrive 15 mins early for check-in and test prep. ? ?Please follow these instructions carefully (unless otherwise directed): ? ? ?On the Night Before the Test: ?Be sure to Drink plenty of water. ?Do not consume any caffeinated/decaffeinated beverages or chocolate 12 hours prior to your  test. ?Do not take any antihistamines 12 hours prior to your test. ? ? ?On the Day of the Test: ?Drink plenty of water until 1 hour prior to the test. ?Do not eat any food 4 hours prior to the test. ?You may take your regular medications prior to the test.  ?Take metoprolol (Lopressor) two hours prior to  test. ?HOLD Furosemide/Hydrochlorothiazide morning of the test. ?FEMALES- please wear underwire-free bra if available, avoid dresses & tight clothing   ? ?     ?After the Test: ?Drink plenty of water. ?After receiving IV contrast, you may experience a mild flushed feeling. This is normal. ?On occasion, you may experience a mild rash up to 24 hours after the test. This is not dangerous. If this occurs, you can take Benadryl 25 mg and increase your fluid intake. ?If you experience trouble breathing, this can be serious. If it is severe call 911 IMMEDIATELY. If it is mild, please call our office. ?If you take any of these medications: Glipizide/Metformin, Avandament, Glucavance, please do not take 48 hours after completing test unless otherwise instructed. ? ?We will call to schedule your test 2-4 weeks out understanding that some insurance companies will need an authorization prior to the service being performed.  ? ?For non-scheduling related questions, please contact the cardiac imaging nurse navigator should you have any questions/concerns: ?Rockwell [place], Cardiac Imaging Nurse Navigator ?Larey Brick, Cardiac Imaging Nurse Navigator ?[facility] ?Direct Office Dial: [phone]  ? ?For scheduling needs, including cancellations and rescheduling, please call [place], [phone]. ? ? ? ? ?Important Information About Sugar ? ? ? ? ? ? ?

## 2021-06-23 NOTE — Progress Notes (Signed)
?  ?Cardiology Office Note ? ? ?Date:  06/23/2021  ? ?ID:  Heidi Holt, DOB 02-Apr-1960, MRN [id number] ? ?PCP:  Antony Contras, MD  ? ? ?No chief complaint on file. ? ?RF for CAD ? ?Wt Readings from Last 3 Encounters:  ?06/23/21 205 lb 6.4 oz (93.2 kg)  ?02/20/20 201 lb 9.6 oz (91.4 kg)  ?09/07/17 227 lb (103 kg)  ?  ? ?  ?History of Present Illness: ?Heidi Holt is a 61 y.o. female who is being seen today for the evaluation of RF for CAD at the request of Antony Contras, MD.  ? ?She has had HTN and hyperlipidemia for at least 10 years.  BP has been well controlled.  Intolerant of statins (Lipitor she believes) due to psoriatic arthritis.   ? ?On a life insurance physical, BNP was elevated. About 375.   ? ?She has had some palpitations. She describes some shortness of breath.  Palpitations 4-5x/day, lasting 1 minute.  Father had MI at 69.  Dizziness with the palpitations.  No syncope. Walking up 2 flights of stairs will cause some SHOB- sx for 1 year.  ? ?Denies : CHest pain; Dizziness. Leg edema. Nitroglycerin use. Orthopnea.  Paroxysmal nocturnal dyspnea. Shortness of breath. Syncope.   ? ?Past Medical History:  ?Diagnosis Date  ? Allergy   ? Anxiety   ? Arthritis   ? Chronic kidney disease, stage 3a ([place])   ? Common migraine with intractable migraine 02/20/2020  ? Depression   ? Gallstones   ? GERD (gastroesophageal reflux disease)   ? History of COVID-19 2019  ? HTN (hypertension)   ? Hypercholesterolemia   ? Hypertension   ? Kidney stone   ? Psoriasis   ? Thyroid disease   ? hx of hypo, pt states has resolved, no meds needed  ? ? ?Past Surgical History:  ?Procedure Laterality Date  ? ABDOMINAL HYSTERECTOMY    ? BREAST LUMPECTOMY    ? left  ? CHOLECYSTECTOMY    ? COLONOSCOPY    ? UPPER GASTROINTESTINAL ENDOSCOPY    ? ? ? ?Current Outpatient Medications  ?Medication Sig Dispense Refill  ? citalopram (CELEXA) 40 MG tablet Take 40 mg by mouth daily.    ? famotidine (PEPCID) 20 MG tablet One at bedtime 30 tablet 11  ?  fluticasone (FLONASE) 50 MCG/ACT nasal spray as needed.    ? folic acid (FOLVITE) 1 MG tablet Take 1 mg by mouth daily.    ? hydrochlorothiazide (HYDRODIURIL) 25 MG tablet Take 25 mg by mouth every morning.    ? ibuprofen (ADVIL,MOTRIN) 200 MG tablet Take 200 mg by mouth as needed.    ? losartan (COZAAR) 100 MG tablet Take 100 mg by mouth daily.    ? pantoprazole (PROTONIX) 40 MG tablet TAKE 1 TABLET BY MOUTH BEFORE BREAKFAST . APPOINTMENT REQUIRED FOR FUTURE REFILLS 90 tablet 0  ? rizatriptan (MAXALT) 10 MG tablet Take 1 tablet (10 mg total) by mouth 3 (three) times daily as needed for migraine. May repeat in 2 hours if needed 10 tablet 3  ? zonisamide (ZONEGRAN) 100 MG capsule Take 1 capsule (100 mg total) by mouth at bedtime. (Patient not taking: Reported on 06/23/2021) 30 capsule 1  ? ?No current facility-administered medications for this visit.  ? ? ?Allergies:   Amlodipine, Crestor [rosuvastatin], Hycodan [hydrocodone bit-homatrop mbr], Lipitor [atorvastatin], Methotrexate, Topamax [topiramate], and Latex  ? ? ?Social History:  The patient  reports that she has never smoked. She has  never used smokeless tobacco. She reports current alcohol use. She reports that she does not use drugs.  ? ?Family History:  The patient's family history includes Asthma in her mother; Bell's palsy in her maternal aunt; Diabetes in her mother; Heart attack (age of onset: 70) in her father; Heart attack (age of onset: 32) in her mother; Heart disease in her father and mother; Hypertension in her father, maternal grandfather, and mother; Seizures in her father; Stomach cancer in her maternal grandmother; Stroke in her paternal grandmother.  ? ? ?ROS:  Please see the history of present illness.   Otherwise, review of systems are positive for DOE, palpitations.   All other systems are reviewed and negative.  ? ? ?PHYSICAL EXAM: ?VS:  BP 136/78   Pulse 72   Ht 5' 6.5" (1.689 m)   Wt 205 lb 6.4 oz (93.2 kg)   SpO2 98%   BMI 32.66  kg/m?  , BMI Body mass index is 32.66 kg/m?. ?GEN: Well nourished, well developed, in no acute distress ?HEENT: normal ?Neck: no JVD, carotid bruits, or masses ?Cardiac: RRR; no murmurs, rubs, or gallops,no edema  ?Respiratory:  clear to auscultation bilaterally, normal work of breathing ?GI: soft, nontender, nondistended, + BS ?MS: no deformity or atrophy ?Skin: warm and dry, no rash ?Neuro:  Strength and sensation are intact ?Psych: euthymic mood, full affect ? ? ?EKG:   ?The ekg ordered today demonstrates NSR, no ST changes ? ? ?Recent Labs: ?No results found for requested labs within last 8760 hours.  ? ?Lipid Panel ?No results found for: CHOL, TRIG, HDL, CHOLHDL, VLDL, LDLCALC, LDLDIRECT ?  ?Other studies Reviewed: ?Additional studies/ records that were reviewed today with results demonstrating: labs reviewed. ? ? ?ASSESSMENT AND PLAN: ? ?Dyspnea on exertion: Given her multiple risk factors for CAD, need to rule out ischemia.  Plan for CTA coronary arteries.  Metoprolol 100 mg prior to the scan.  Calcium score will also be helpful to determine what to do with lipid-lowering therapy. Check echo as well.  ?Hyperlipidemia: She was intolerant to atorvastatin many years ago.  I discussed starting rosuvastatin with her if her calcium score is elevated.  She seemed agreeable.  Whole food, plant-based diet recommended. ?Hypertension: Blood pressure well controlled.  124/66 by my recheck.  Avoid processed foods. ?Family history of early coronary artery disease: Father with CAD in his 35s. ?Palpitations: They sound benign.  Very short lived episodes without syncope.  If LV function is normal, may not need further evaluation. ?Elevated BNP: Noted on an insurance exam.  She appears euvolemic.  She is on a mild diuretic.  Await echo results. ? ? ?Current medicines are reviewed at length with the patient today.  The patient concerns regarding her medicines were addressed. ? ?The following changes have been made:  No  change ? ?Labs/ tests ordered today include: as above ?No orders of the defined types were placed in this encounter. ? ? ?Recommend 150 minutes/week of aerobic exercise ?Low fat, low carb, high fiber diet recommended ? ?Disposition:   FU for tests ? ? ?Signed, ?Larae Grooms, MD  ?06/23/2021 1:43 PM    ?[facility] ?[place], [place], [place]  [address] ?Phone: [phone]; Fax: [phone]  ? ?

## 2021-06-24 LAB — BASIC METABOLIC PANEL
BUN/Creatinine Ratio: 32 — ABNORMAL HIGH (ref 12–28)
BUN: 28 mg/dL — ABNORMAL HIGH (ref 8–27)
CO2: 22 mmol/L (ref 20–29)
Calcium: 8.6 mg/dL — ABNORMAL LOW (ref 8.7–10.3)
Chloride: 101 mmol/L (ref 96–106)
Creatinine, Ser: 0.87 mg/dL (ref 0.57–1.00)
Glucose: 93 mg/dL (ref 70–99)
Potassium: 3.7 mmol/L (ref 3.5–5.2)
Sodium: 139 mmol/L (ref 134–144)
eGFR: 76 mL/min/{1.73_m2} (ref >59)

## 2021-07-12 ENCOUNTER — Ambulatory Visit (HOSPITAL_COMMUNITY): Payer: 59 | Attending: Cardiovascular Disease

## 2021-07-12 DIAGNOSIS — R7989 Other specified abnormal findings of blood chemistry: Secondary | ICD-10-CM

## 2021-07-12 DIAGNOSIS — R0609 Other forms of dyspnea: Secondary | ICD-10-CM

## 2021-07-13 LAB — ECHOCARDIOGRAM COMPLETE
Area-P 1/2: 3.54 cm2
S' Lateral: 2.9 cm

## 2021-07-15 ENCOUNTER — Telehealth (HOSPITAL_COMMUNITY): Payer: Self-pay | Admitting: Emergency Medicine

## 2021-07-15 NOTE — Telephone Encounter (Signed)
Attempted to call patient regarding upcoming cardiac CT appointment. °Left message on voicemail with name and callback number °Jude Linck RN Navigator Cardiac Imaging °[facility] °[phone] Office °[phone] Cell ° °

## 2021-07-15 NOTE — Telephone Encounter (Signed)
Reaching out to patient to offer assistance regarding upcoming cardiac imaging study; pt verbalizes understanding of appt date/time, parking situation and where to check in, pre-test NPO status and medications ordered, and verified current allergies; name and call back number provided for further questions should they arise ?Solymar Grace RN Navigator Cardiac Imaging ?[facility] ?[phone] office ?[phone] cell ? ?Denies iv issues ? 100mg metoprolol tartrate ?Arrival 200 ?

## 2021-07-18 ENCOUNTER — Encounter (HOSPITAL_COMMUNITY): Payer: Self-pay

## 2021-07-18 ENCOUNTER — Ambulatory Visit (HOSPITAL_COMMUNITY)
Admission: RE | Admit: 2021-07-18 | Discharge: 2021-07-18 | Disposition: A | Discharge status: Home / Self Care | Payer: Commercial Managed Care - PPO | Source: Ambulatory Visit | Attending: Interventional Cardiology | Admitting: Interventional Cardiology

## 2021-07-18 DIAGNOSIS — R931 Abnormal findings on diagnostic imaging of heart and coronary circulation: Secondary | ICD-10-CM | POA: Diagnosis present

## 2021-07-18 DIAGNOSIS — E782 Mixed hyperlipidemia: Secondary | ICD-10-CM | POA: Insufficient documentation

## 2021-07-18 DIAGNOSIS — R0609 Other forms of dyspnea: Secondary | ICD-10-CM | POA: Diagnosis present

## 2021-07-18 DIAGNOSIS — I2510 Atherosclerotic heart disease of native coronary artery without angina pectoris: Secondary | ICD-10-CM | POA: Diagnosis not present

## 2021-07-18 DIAGNOSIS — Z8249 Family history of ischemic heart disease and other diseases of the circulatory system: Secondary | ICD-10-CM | POA: Insufficient documentation

## 2021-07-18 MED ORDER — NITROGLYCERIN 0.4 MG SL SUBL
0.8000 mg | SUBLINGUAL_TABLET | Freq: Once | SUBLINGUAL | Status: AC
  Administered 2021-07-18: 0.8 mg via SUBLINGUAL

## 2021-07-18 MED ORDER — IOHEXOL 350 MG/ML SOLN
100.0000 mL | Freq: Once | INTRAVENOUS | Status: AC | PRN
  Administered 2021-07-18: 100 mL via INTRAVENOUS

## 2021-07-18 MED ORDER — NITROGLYCERIN 0.4 MG SL SUBL
SUBLINGUAL_TABLET | SUBLINGUAL | Status: AC
  Filled 2021-07-18: qty 2

## 2021-07-19 ENCOUNTER — Encounter: Payer: Self-pay | Admitting: Interventional Cardiology

## 2021-07-19 ENCOUNTER — Ambulatory Visit (HOSPITAL_BASED_OUTPATIENT_CLINIC_OR_DEPARTMENT_OTHER)
Admission: RE | Admit: 2021-07-19 | Discharge: 2021-07-19 | Disposition: A | Discharge status: Home / Self Care | Payer: Commercial Managed Care - PPO | Source: Ambulatory Visit | Attending: Internal Medicine | Admitting: Internal Medicine

## 2021-07-19 ENCOUNTER — Other Ambulatory Visit: Payer: Self-pay | Admitting: Internal Medicine

## 2021-07-19 ENCOUNTER — Ambulatory Visit (HOSPITAL_COMMUNITY)
Admission: RE | Admit: 2021-07-19 | Discharge: 2021-07-19 | Disposition: A | Discharge status: Home / Self Care | Payer: Commercial Managed Care - PPO | Source: Ambulatory Visit | Attending: Internal Medicine | Admitting: Internal Medicine

## 2021-07-19 DIAGNOSIS — R931 Abnormal findings on diagnostic imaging of heart and coronary circulation: Secondary | ICD-10-CM

## 2021-07-22 ENCOUNTER — Encounter: Payer: Self-pay | Admitting: *Deleted

## 2021-07-22 ENCOUNTER — Telehealth: Payer: Self-pay | Admitting: *Deleted

## 2021-07-22 DIAGNOSIS — R931 Abnormal findings on diagnostic imaging of heart and coronary circulation: Secondary | ICD-10-CM

## 2021-07-22 DIAGNOSIS — Z01812 Encounter for preprocedural laboratory examination: Secondary | ICD-10-CM

## 2021-07-22 DIAGNOSIS — R0609 Other forms of dyspnea: Secondary | ICD-10-CM

## 2021-07-22 NOTE — Telephone Encounter (Signed)
I spoke with patient and scheduled cath for 07/28/21.  Patient will come to office for lab work and EKG on 07/25/21 at 2:00. I verbally went over cath instructions with patient and will send copy to her through my chart.

## 2021-07-22 NOTE — Telephone Encounter (Signed)
-----   Message from Corky Crafts, MD sent at 07/19/2021  4:51 PM EDT ----- I called the patient and let her know the results. She is agreeable to a cath.

## 2021-07-25 ENCOUNTER — Ambulatory Visit (INDEPENDENT_AMBULATORY_CARE_PROVIDER_SITE_OTHER): Payer: 59

## 2021-07-25 ENCOUNTER — Other Ambulatory Visit: Payer: 59

## 2021-07-25 VITALS — Ht 66.5 in | Wt 208.4 lb

## 2021-07-25 DIAGNOSIS — Z01812 Encounter for preprocedural laboratory examination: Secondary | ICD-10-CM

## 2021-07-25 DIAGNOSIS — R0609 Other forms of dyspnea: Secondary | ICD-10-CM

## 2021-07-25 DIAGNOSIS — R931 Abnormal findings on diagnostic imaging of heart and coronary circulation: Secondary | ICD-10-CM

## 2021-07-25 NOTE — Progress Notes (Signed)
   Nurse Visit   Date of Encounter: 07/25/2021 ID: Heidi Holt, DOB 03/31/1960, MRN [id number]  PCP:  Tally Joe, MD   [facility] Providers Cardiologist:  Lance Muss, MD      Visit Details   VS:  Ht 5' 6.5" (1.689 m)   Wt 208 lb 6.4 oz (94.5 kg)   BMI 33.13 kg/m  , BMI Body mass index is 33.13 kg/m.  Wt Readings from Last 3 Encounters:  07/25/21 208 lb 6.4 oz (94.5 kg)  06/23/21 205 lb 6.4 oz (93.2 kg)  02/20/20 201 lb 9.6 oz (91.4 kg)     Reason for visit: EKG for 07-28-21 Heart Cath Performed today: Vitals, EKG, Provider consulted:Dr. Shari Prows, and Education Changes (medications, testing, etc.) : NONE Length of Visit: 20 minutes    Medications Adjustments/Labs and Tests Ordered: Orders Placed This Encounter  Procedures   EKG 12-Lead   No orders of the defined types were placed in this encounter.    Signed, Macie Burows, RN  07/25/2021 2:53 PM

## 2021-07-25 NOTE — Patient Instructions (Signed)
Medication Instructions:  Your physician recommends that you continue on your current medications as directed. Please refer to the Current Medication list given to you today.  *If you need a refill on your cardiac medications before your next appointment, please call your pharmacy*   Lab Work: NONE If you have labs (blood work) drawn today and your tests are completely normal, you will receive your results only by: MyChart Message (if you have MyChart) OR A paper copy in the mail If you have any lab test that is abnormal or we need to change your treatment, we will call you to review the results.   Testing/Procedures: NONE   Follow-Up: To be determined At [facility], you and your health needs are our priority.  As part of our continuing mission to provide you with exceptional heart care, we have created designated Provider Care Teams.  These Care Teams include your primary Cardiologist (physician) and Advanced Practice Providers (APPs -  Physician Assistants and Nurse Practitioners) who all work together to provide you with the care you need, when you need it.}     Important Information About Sugar

## 2021-07-26 ENCOUNTER — Ambulatory Visit: Payer: 59 | Admitting: Cardiovascular Disease

## 2021-07-26 LAB — BASIC METABOLIC PANEL
BUN/Creatinine Ratio: 36 — ABNORMAL HIGH (ref 12–28)
BUN: 32 mg/dL — ABNORMAL HIGH (ref 8–27)
CO2: 20 mmol/L (ref 20–29)
Calcium: 9.5 mg/dL (ref 8.7–10.3)
Chloride: 105 mmol/L (ref 96–106)
Creatinine, Ser: 0.9 mg/dL (ref 0.57–1.00)
Glucose: 73 mg/dL (ref 70–99)
Potassium: 3.9 mmol/L (ref 3.5–5.2)
Sodium: 143 mmol/L (ref 134–144)
eGFR: 73 mL/min/{1.73_m2} (ref >59)

## 2021-07-26 LAB — CBC
Hematocrit: 33.3 % — ABNORMAL LOW (ref 34.0–46.6)
Hemoglobin: 11.4 g/dL (ref 11.1–15.9)
MCH: 31.8 pg (ref 26.6–33.0)
MCHC: 34.2 g/dL (ref 31.5–35.7)
MCV: 93 fL (ref 79–97)
Platelets: 240 10*3/uL (ref 150–450)
RBC: 3.59 x10E6/uL — ABNORMAL LOW (ref 3.77–5.28)
RDW: 11.7 % (ref 11.7–15.4)
WBC: 8.1 10*3/uL (ref 3.4–10.8)

## 2021-07-27 ENCOUNTER — Telehealth: Payer: Self-pay | Admitting: *Deleted

## 2021-07-27 NOTE — Telephone Encounter (Signed)
Cardiac Catheterization scheduled at [facility] for: Thursday July 28, 2021 10:30 AM Arrival time and place: [facility] Main Entrance A at: 8:30 AM   Nothing to eat after midnight prior to procedure, clear liquids until 5 AM day of procedure.  Medication instructions: -Hold:  HCTZ-AM of procedure -Except hold medications usual morning medications can be taken with sips of water including aspirin 81 mg.  Confirmed patient has responsible adult to drive home post procedure and be with patient first 24 hours after arriving home.  Patient reports no new symptoms concerning for COVID-19.  Reviewed procedure instructions with patient.

## 2021-07-28 ENCOUNTER — Other Ambulatory Visit (HOSPITAL_COMMUNITY): Payer: Self-pay

## 2021-07-28 ENCOUNTER — Ambulatory Visit (HOSPITAL_COMMUNITY)
Admission: RE | Admit: 2021-07-28 | Discharge: 2021-07-28 | Disposition: A | Discharge status: Home / Self Care | Payer: 59 | Attending: Interventional Cardiology | Admitting: Interventional Cardiology

## 2021-07-28 ENCOUNTER — Other Ambulatory Visit: Payer: Self-pay

## 2021-07-28 ENCOUNTER — Encounter (HOSPITAL_COMMUNITY)
Admission: RE | Disposition: A | Discharge status: Home / Self Care | Payer: Self-pay | Source: Home / Self Care | Attending: Interventional Cardiology

## 2021-07-28 DIAGNOSIS — E785 Hyperlipidemia, unspecified: Secondary | ICD-10-CM | POA: Insufficient documentation

## 2021-07-28 DIAGNOSIS — I2510 Atherosclerotic heart disease of native coronary artery without angina pectoris: Secondary | ICD-10-CM

## 2021-07-28 DIAGNOSIS — Z8249 Family history of ischemic heart disease and other diseases of the circulatory system: Secondary | ICD-10-CM | POA: Insufficient documentation

## 2021-07-28 DIAGNOSIS — N1831 Chronic kidney disease, stage 3a: Secondary | ICD-10-CM | POA: Diagnosis not present

## 2021-07-28 DIAGNOSIS — E039 Hypothyroidism, unspecified: Secondary | ICD-10-CM | POA: Insufficient documentation

## 2021-07-28 DIAGNOSIS — I25118 Atherosclerotic heart disease of native coronary artery with other forms of angina pectoris: Secondary | ICD-10-CM | POA: Diagnosis present

## 2021-07-28 DIAGNOSIS — I129 Hypertensive chronic kidney disease with stage 1 through stage 4 chronic kidney disease, or unspecified chronic kidney disease: Secondary | ICD-10-CM | POA: Diagnosis not present

## 2021-07-28 DIAGNOSIS — I2582 Chronic total occlusion of coronary artery: Secondary | ICD-10-CM | POA: Diagnosis not present

## 2021-07-28 DIAGNOSIS — R0609 Other forms of dyspnea: Secondary | ICD-10-CM | POA: Diagnosis not present

## 2021-07-28 HISTORY — PX: LEFT HEART CATH AND CORONARY ANGIOGRAPHY: CATH118249

## 2021-07-28 LAB — POCT ACTIVATED CLOTTING TIME
Activated Clotting Time: 263 seconds
Activated Clotting Time: 251 seconds

## 2021-07-28 SURGERY — LEFT HEART CATH AND CORONARY ANGIOGRAPHY
Anesthesia: LOCAL

## 2021-07-28 MED ORDER — HEPARIN SODIUM (PORCINE) 1000 UNIT/ML IJ SOLN
INTRAMUSCULAR | Status: AC
  Filled 2021-07-28: qty 10

## 2021-07-28 MED ORDER — FENTANYL CITRATE (PF) 100 MCG/2ML IJ SOLN
INTRAMUSCULAR | Status: AC
  Filled 2021-07-28: qty 2

## 2021-07-28 MED ORDER — ONDANSETRON HCL 4 MG/2ML IJ SOLN: 4.0000 mg | Freq: Four times a day (QID) | INTRAMUSCULAR | Status: DC | PRN

## 2021-07-28 MED ORDER — SODIUM CHLORIDE 0.9 % IV SOLN: 250.0000 mL | INTRAVENOUS | Status: DC | PRN

## 2021-07-28 MED ORDER — HEPARIN SODIUM (PORCINE) 1000 UNIT/ML IJ SOLN
INTRAMUSCULAR | Status: DC | PRN
  Administered 2021-07-28: 2000 [IU] via INTRAVENOUS
  Administered 2021-07-28: 10000 [IU] via INTRAVENOUS

## 2021-07-28 MED ORDER — SODIUM CHLORIDE 0.9 % IV SOLN: INTRAVENOUS | Status: DC

## 2021-07-28 MED ORDER — CLOPIDOGREL BISULFATE 75 MG PO TABS: 75.0000 mg | ORAL_TABLET | Freq: Every day | ORAL | Status: DC

## 2021-07-28 MED ORDER — SODIUM CHLORIDE 0.9% FLUSH: 3.0000 mL | Freq: Two times a day (BID) | INTRAVENOUS | Status: DC

## 2021-07-28 MED ORDER — ASPIRIN 81 MG PO CHEW: 81.0000 mg | CHEWABLE_TABLET | Freq: Every day | ORAL | Status: DC

## 2021-07-28 MED ORDER — CLOPIDOGREL BISULFATE 300 MG PO TABS
ORAL_TABLET | ORAL | Status: AC
  Filled 2021-07-28: qty 2

## 2021-07-28 MED ORDER — NITROGLYCERIN 1 MG/10 ML FOR IR/CATH LAB
INTRA_ARTERIAL | Status: AC
  Filled 2021-07-28: qty 10

## 2021-07-28 MED ORDER — HEPARIN (PORCINE) IN NACL 1000-0.9 UT/500ML-% IV SOLN
INTRAVENOUS | Status: DC | PRN
  Administered 2021-07-28 (×2): 500 mL

## 2021-07-28 MED ORDER — MIDAZOLAM HCL 2 MG/2ML IJ SOLN
INTRAMUSCULAR | Status: AC
  Filled 2021-07-28: qty 2

## 2021-07-28 MED ORDER — SODIUM CHLORIDE 0.9% FLUSH: 3.0000 mL | INTRAVENOUS | Status: DC | PRN

## 2021-07-28 MED ORDER — IOHEXOL 350 MG/ML SOLN
INTRAVENOUS | Status: DC | PRN
  Administered 2021-07-28: 115 mL via INTRA_ARTERIAL

## 2021-07-28 MED ORDER — SODIUM CHLORIDE 0.9 % WEIGHT BASED INFUSION
1.0000 mL/kg/h | INTRAVENOUS | Status: DC
  Administered 2021-07-28: 1 mL/kg/h via INTRAVENOUS

## 2021-07-28 MED ORDER — ACETAMINOPHEN 325 MG PO TABS
650.0000 mg | ORAL_TABLET | ORAL | Status: DC | PRN
  Administered 2021-07-28: 650 mg via ORAL
  Filled 2021-07-28: qty 2

## 2021-07-28 MED ORDER — LIDOCAINE HCL (PF) 1 % IJ SOLN
INTRAMUSCULAR | Status: DC | PRN
  Administered 2021-07-28: 2 mL

## 2021-07-28 MED ORDER — ASPIRIN 81 MG PO CHEW: 81.0000 mg | CHEWABLE_TABLET | ORAL | Status: DC

## 2021-07-28 MED ORDER — FENTANYL CITRATE (PF) 100 MCG/2ML IJ SOLN
INTRAMUSCULAR | Status: DC | PRN
Start: 2021-07-28 — End: 2021-07-28
  Administered 2021-07-28 (×5): 25 ug via INTRAVENOUS

## 2021-07-28 MED ORDER — FAMOTIDINE IN NACL 20-0.9 MG/50ML-% IV SOLN
INTRAVENOUS | Status: DC | PRN
  Administered 2021-07-28: 20 mg via INTRAVENOUS

## 2021-07-28 MED ORDER — CLOPIDOGREL BISULFATE 300 MG PO TABS
ORAL_TABLET | ORAL | Status: DC | PRN
  Administered 2021-07-28: 600 mg via ORAL

## 2021-07-28 MED ORDER — VERAPAMIL HCL 2.5 MG/ML IV SOLN: INTRAVENOUS | Status: DC | PRN

## 2021-07-28 MED ORDER — FAMOTIDINE IN NACL 20-0.9 MG/50ML-% IV SOLN
INTRAVENOUS | Status: AC
  Filled 2021-07-28: qty 50

## 2021-07-28 MED ORDER — HEPARIN (PORCINE) IN NACL 1000-0.9 UT/500ML-% IV SOLN
INTRAVENOUS | Status: AC
  Filled 2021-07-28: qty 1000

## 2021-07-28 MED ORDER — LABETALOL HCL 5 MG/ML IV SOLN: 10.0000 mg | INTRAVENOUS | Status: DC | PRN

## 2021-07-28 MED ORDER — NITROGLYCERIN 0.4 MG SL SUBL
0.4000 mg | SUBLINGUAL_TABLET | SUBLINGUAL | 0 refills | Status: DC | PRN
  Filled 2021-07-28: qty 25, 7d supply, fill #0

## 2021-07-28 MED ORDER — MIDAZOLAM HCL 2 MG/2ML IJ SOLN
INTRAMUSCULAR | Status: DC | PRN
  Administered 2021-07-28 (×2): 1 mg via INTRAVENOUS
  Administered 2021-07-28: 2 mg via INTRAVENOUS
  Administered 2021-07-28: 1 mg via INTRAVENOUS

## 2021-07-28 MED ORDER — NITROGLYCERIN 1 MG/10 ML FOR IR/CATH LAB
INTRA_ARTERIAL | Status: DC | PRN
  Administered 2021-07-28 (×3): 200 ug via INTRA_ARTERIAL

## 2021-07-28 MED ORDER — HYDRALAZINE HCL 20 MG/ML IJ SOLN: 10.0000 mg | INTRAMUSCULAR | Status: DC | PRN

## 2021-07-28 MED ORDER — SODIUM CHLORIDE 0.9 % WEIGHT BASED INFUSION
3.0000 mL/kg/h | INTRAVENOUS | Status: AC
  Administered 2021-07-28: 3 mL/kg/h via INTRAVENOUS

## 2021-07-28 MED ORDER — VERAPAMIL HCL 2.5 MG/ML IV SOLN
INTRAVENOUS | Status: AC
  Filled 2021-07-28: qty 2

## 2021-07-28 MED ORDER — CLOPIDOGREL BISULFATE 75 MG PO TABS
75.0000 mg | ORAL_TABLET | Freq: Every day | ORAL | 3 refills | Status: AC
  Filled 2021-07-28 – 2021-09-01 (×2): qty 30, 30d supply, fill #0
  Filled 2021-09-30: qty 30, 30d supply, fill #1
  Filled 2021-10-13 – 2021-10-31 (×2): qty 30, 30d supply, fill #2
  Filled 2021-12-20: qty 30, 30d supply, fill #3
  Filled 2022-01-25 – 2022-02-04 (×2): qty 30, 30d supply, fill #4
  Filled 2022-02-08 – 2022-03-11 (×2): qty 30, 30d supply, fill #5
  Filled 2022-04-13: qty 30, 30d supply, fill #6
  Filled 2022-05-31 – 2022-06-07 (×2): qty 30, 30d supply, fill #7
  Filled 2022-07-01: qty 30, 30d supply, fill #8

## 2021-07-28 MED ORDER — ASPIRIN 81 MG PO TBEC
81.0000 mg | DELAYED_RELEASE_TABLET | Freq: Every day | ORAL | 3 refills | Status: AC
  Filled 2021-07-28 – 2021-10-31 (×4): qty 90, 90d supply, fill #0

## 2021-07-28 MED ORDER — LIDOCAINE HCL (PF) 1 % IJ SOLN
INTRAMUSCULAR | Status: AC
  Filled 2021-07-28: qty 30

## 2021-07-28 MED ORDER — METOPROLOL TARTRATE 25 MG PO TABS
12.5000 mg | ORAL_TABLET | Freq: Two times a day (BID) | ORAL | 11 refills | Status: DC
  Filled 2021-07-28 – 2021-09-01 (×2): qty 30, 30d supply, fill #0
  Filled 2021-09-30: qty 30, 30d supply, fill #1
  Filled 2021-10-08 – 2021-10-31 (×3): qty 30, 30d supply, fill #2
  Filled 2021-12-20: qty 30, 30d supply, fill #3
  Filled 2022-01-25 – 2022-02-04 (×2): qty 30, 30d supply, fill #4
  Filled 2022-02-08 – 2022-03-11 (×2): qty 30, 30d supply, fill #5

## 2021-07-28 SURGICAL SUPPLY — 22 items
BALLN SPRINTER MX OTW 2.0X12 (BALLOONS) ×2
BALLOON SPRINTER MX OTW 2.0X12 (BALLOONS) IMPLANT
BAND CMPR LRG ZPHR (HEMOSTASIS) ×1
BAND ZEPHYR COMPRESS 30 LONG (HEMOSTASIS) ×1 IMPLANT
CATH 5FR JL3.5 JR4 ANG PIG MP (CATHETERS) ×1 IMPLANT
CATH INFINITI 5FR AL1 (CATHETERS) ×1 IMPLANT
CATH LAUNCHER 6FR AL1 (CATHETERS) IMPLANT
CATHETER LAUNCHER 6FR AL1 (CATHETERS) ×2
GLIDESHEATH SLEND SS 6F .021 (SHEATH) ×1 IMPLANT
GUIDEWIRE INQWIRE 1.5J.035X260 (WIRE) IMPLANT
INQWIRE 1.5J .035X260CM (WIRE) ×2
KIT ENCORE 26 ADVANTAGE (KITS) ×1 IMPLANT
KIT HEART LEFT (KITS) ×3 IMPLANT
KIT HEMO VALVE WATCHDOG (MISCELLANEOUS) ×1 IMPLANT
PACK CARDIAC CATHETERIZATION (CUSTOM PROCEDURE TRAY) ×3 IMPLANT
SHEATH 6FR 85 DEST SLENDER (SHEATH) ×1 IMPLANT
SHEATH PROBE COVER 6X72 (BAG) ×1 IMPLANT
TRANSDUCER W/STOPCOCK (MISCELLANEOUS) ×3 IMPLANT
TUBING CIL FLEX 10 FLL-RA (TUBING) ×3 IMPLANT
WIRE ASAHI MIRACLEBROS-3 180CM (WIRE) ×1 IMPLANT
WIRE ASAHI PROWATER 300CM (WIRE) ×1 IMPLANT
WIRE FIGHTER CROSSING 190CM (WIRE) ×1 IMPLANT

## 2021-07-28 NOTE — Discharge Summary (Addendum)
Discharge Summary for Same Day PCI   Patient ID: CORTEZ NACKE MRN: [id number]; DOB: April 07, 1960  Admit date: 07/28/2021 Discharge date: 07/28/2021  Primary Care Provider: Antony Contras, MD  Primary Cardiologist: Larae Grooms, MD  Primary Electrophysiologist:  None   Discharge Diagnoses    Active Problems:   Coronary artery disease    Diagnostic Studies/Procedures    Cardiac Catheterization 07/28/2021:    Mid RCA lesion is 100% stenosed.  Brisk left to right collaterals.  This was a chronic total occlusion.  Attempt at PCI of the mid RCA was unsuccessful despite using multiple wires.  Severe right subclavian tortuosity necessitated using an 85 cm destination sheath.  Support from the right radial approach was challenging.   Ost RCA to Prox RCA lesion is 25% stenosed.   The left ventricular systolic function is normal.   LV end diastolic pressure is normal.   The left ventricular ejection fraction is 55-65% by visual estimate.   There is no aortic valve stenosis.   Single-vessel CAD with a chronic total occlusion of the mid RCA.  Brisk left to right collaterals.  Left system is widely patent.  Normal LV function.  Normal LVEDP.  Continue medical therapy.  If she had refractory symptoms, could consider intervention from the femoral approach for better support.  Diagnostic Dominance: Right  _____________   History of Present Illness     Heidi Holt is a 61 y.o. female with PMH of hypertension, hyperlipidemia, hypothyroidism who was referred to Dr. Irish Lack for dyspnea on exertion..  She had a life insurance physical and her BNP was elevated.  She had also reported some palpitations.  Strong family history with father having an MI at the age of 21.  Noted that walking up stairs have caused some increased shortness of breath over the past year.  Underwent outpatient coronary CTA which showed CTO of the mid RCA with collateral flow.  Results were called to the patient with  recommendations to undergo cardiac catheterization for further evaluation.  Hospital Course     The patient underwent cardiac cath as noted above with single-vessel CAD with CTO of mid RCA, brisk left-to-right collaterals.  Unsuccessful attempt at PCI of the mid RCA despite using multiple wires.. Plan for DAPT with ASA/plavix for now. Maybe bring back for CTO PCI attempt in the future. There were no observed complications post cath. Radial cath site was re-evaluated prior to discharge and found to be stable without any complications. Instructions/precautions regarding cath site care were given prior to discharge. Added metoprolol 12.5mg  BID, asked that she stop HCTZ for now. Lipid clinic referral as she is statin intolerant   Heidi Holt was seen by Dr. Irish Lack and determined stable for discharge home. Follow up with our office has been arranged. Medications are listed below. Pertinent changes include addition of asa, plavix, metoprolol.  _____________  Cath/PCI Registry Performance & Quality Measures: Aspirin prescribed? - Yes ADP Receptor Inhibitor (Plavix/Clopidogrel, Brilinta/Ticagrelor or Effient/Prasugrel) prescribed (includes medically managed patients)? - Yes High Intensity Statin (Lipitor 40-80mg  or Crestor 20-40mg ) prescribed? - No - intolerant  For EF <40%, was ACEI/ARB prescribed? - Not Applicable (EF >/= [id number]) For EF <40%, Aldosterone Antagonist (Spironolactone or Eplerenone) prescribed? - Not Applicable (EF >/= [id number]) Cardiac Rehab Phase II ordered (Included Medically managed Patients)? - No - no PCI completed  _____________   Discharge Vitals Blood pressure 139/66, pulse 69, temperature 98.5 F (36.9 C), temperature source Oral, resp. rate 14, height 5\' 7"  (1.702 m),  weight 89.8 kg, SpO2 97 %.  Filed Weights   07/28/21 0830  Weight: 89.8 kg    Last Labs & Radiologic Studies    CBC No results for input(s): "WBC", "NEUTROABS", "HGB", "HCT", "MCV", "PLT" in the last 72  hours.  Basic Metabolic Panel No results for input(s): "NA", "K", "CL", "CO2", "GLUCOSE", "BUN", "CREATININE", "CALCIUM", "MG", "PHOS" in the last 72 hours.  Liver Function Tests No results for input(s): "AST", "ALT", "ALKPHOS", "BILITOT", "PROT", "ALBUMIN" in the last 72 hours. No results for input(s): "LIPASE", "AMYLASE" in the last 72 hours. High Sensitivity Troponin:   No results for input(s): "TROPONINIHS" in the last 720 hours.  BNP Invalid input(s): "POCBNP" D-Dimer No results for input(s): "DDIMER" in the last 72 hours. Hemoglobin A1C No results for input(s): "HGBA1C" in the last 72 hours. Fasting Lipid Panel No results for input(s): "CHOL", "HDL", "LDLCALC", "TRIG", "CHOLHDL", "LDLDIRECT" in the last 72 hours. Thyroid Function Tests No results for input(s): "TSH", "T4TOTAL", "T3FREE", "THYROIDAB" in the last 72 hours.  Invalid input(s): "FREET3" _____________  CARDIAC CATHETERIZATION  Result Date: 07/28/2021   Mid RCA lesion is 100% stenosed.  Brisk left to right collaterals.  This was a chronic total occlusion.  Attempt at PCI of the mid RCA was unsuccessful despite using multiple wires.  Severe right subclavian tortuosity necessitated using an 85 cm destination sheath.  Support from the right radial approach was challenging.   Ost RCA to Prox RCA lesion is 25% stenosed.   The left ventricular systolic function is normal.   LV end diastolic pressure is normal.   The left ventricular ejection fraction is 55-65% by visual estimate.   There is no aortic valve stenosis. Single-vessel CAD with a chronic total occlusion of the mid RCA.  Brisk left to right collaterals.  Left system is widely patent.  Normal LV function.  Normal LVEDP.  Continue medical therapy.  If she had refractory symptoms, could consider intervention from the femoral approach for better support.   CT CORONARY FRACTIONAL FLOW RESERVE FLUID ANALYSIS  Result Date: 07/19/2021 EXAM: CT FFR ANALYSIS CLINICAL DATA:   abnormal coronary ct FINDINGS: FFRct analysis was performed on the original cardiac CT angiogram dataset. Diagrammatic representation of the FFRct analysis is provided in a separate PDF document in PACS. This dictation was created using the PDF document and an interactive 3D model of the results. 3D model is not available in the EMR/PACS. Normal FFR range is >0.80. Indeterminate (grey) zone is 0.76-0.80. FFR delta of 0.13 is considered significant. 1. Left Main: FFR = 0.94 2. LAD: Proximal FFR = 0.89, mid FFR = 0.83, Distal FFR = 0.75 3. LCX: Proximal FFR = 0.91, distal FFR = not mapped, OM1 FFR = 0.82 4. RCA: Proximal FFR = 0.94, mid FFR =modeled as totally occluded IMPRESSION: 1.  CT FFR analysis demonstrates total occlusion of the mid RCA. 2. CT FFR analysis demonstrate significant FFR in the distal LAD, 0.75. This may represent diffuse disease vs hemodynamic significance of moderately stenosed mid LAD lesions. Consider cath correlation. RECOMMENDATIONS: Recommend cardiac catheterization if clinically indicated. Guideline-directed medical therapy and aggressive risk factor modification for secondary prevention of coronary artery disease. Electronically Signed   By: Weston Brass M.D.   On: 07/19/2021 08:05   CT CORONARY MORPH W/CTA COR W/SCORE W/CA W/CM &/OR WO/CM  Addendum Date: 07/19/2021   ADDENDUM REPORT: 07/19/2021 07:59 HISTORY: Dyspnea on exertion (DOE) EXAM: Cardiac/Coronary  CT TECHNIQUE: The patient was scanned on a [employer]. PROTOCOL:  A 120 kV prospective scan was triggered in the descending thoracic aorta at 111 HU's. Axial non-contrast 3 mm slices were carried out through the heart. The data set was analyzed on a dedicated work station and scored using the Agatston method. Gantry rotation speed was 250 msecs and collimation was .6 mm. Beta blockade and 0.8 mg of sl NTG was given. The 3D data set was reconstructed in 5% intervals of the 35-75 % of the R-R cycle. Systolic and  diastolic phases were analyzed on a dedicated work station using MPR, MIP and VRT modes. The patient received 149mL OMNIPAQUE IOHEXOL 350 MG/ML SOLN contrast. FINDINGS: Image quality: Average Noise artifact is: Mild slab artifact Coronary calcium score is 588, which places the patient in the 99th percentile for age and sex matched control. Coronary arteries: Normal coronary origins.  Right dominance. Right Coronary Artery: Mild mixed atherosclerotic plaque, serial lesions, in the proximal RCA, 25-49% stenosis. Moderate mixed atherosclerotic plaque in the mid RCA, 50-69% stenosis. Severe mixed plaque progressing to complete occlusion of the mid RCA, 100% stenosed. RV branch is patent, and distal to the lesion the RCA recanalizes suggesting filling by collaterals vs small channel of flow. Small caliber distal RCA with patent PDA and PLA, suggesting right dominance. Left Main Coronary Artery: Minimal mixed atherosclerotic plaque in the distal LM, <25% stenosis. Left Anterior Descending Coronary Artery: Mild mixed atherosclerotic plaque in the ostial and proximal LAD, and ostial first diagonal branch, 25-49% stenosis. Moderate mixed atherosclerotic plaque in the mid LAD, 50-69% stenosis, interpretation may be impacted by calcium blooming artifact. Patent D2 and D3, small caliber. Patent D4. Left Circumflex Artery: Mild mixed atherosclerotic plaque in the proximal LCx, 25-49% stenosis. Patent OM1. Aorta: Normal size, 33 mm at the mid ascending aorta (level of the PA bifurcation) measured double oblique. No calcifications. No dissection. Aortic Valve: Trivial calcifications. Other findings: Normal pulmonary vein drainage into the left atrium. Normal left atrial appendage without thrombus. Normal size of the pulmonary artery. Moderate biatrial enlargement. IMPRESSION: 1. Total occlusion of the mid RCA with distal recanalization vs collateral flow, CADRADS = 5. CT FFR will be performed and reported separately. 2. Coronary  calcium score is 588, which places the patient in the 99th percentile for age and sex matched control. 3. Normal coronary origins with right dominance. RECOMMENDATIONS CAD-RADS 5 Total coronary occlusion (100%). Consider cardiac catheterization or viability assessment. Consider symptom-guided anti-ischemic pharmacotherapy as well as risk factor modification per guideline directed care. Electronically Signed   By: Cherlynn Kaiser M.D.   On: 07/19/2021 07:59   Result Date: 07/19/2021 EXAM: OVER-READ INTERPRETATION  CT CHEST The following report is a limited chest CT over-read performed by radiologist Dr. Van Clines of [facility], [place] on 07/18/2021. This over-read does not include interpretation of cardiac or coronary anatomy or pathology. The coronary CTA interpretation by the cardiologist is attached. COMPARISON:  None Available. FINDINGS: Extracardiac vascular: Unremarkable Mediastinum/Nodes: Small type 1 hiatal hernia. Lungs/Pleura: Unremarkable Upper Abdomen: Unremarkable Musculoskeletal: Mild thoracic spondylosis. IMPRESSION: 1. Small type 1 hiatal hernia. 2. Mild thoracic spondylosis Electronically Signed: By: Van Clines M.D. On: 07/18/2021 16:40   ECHOCARDIOGRAM COMPLETE  Result Date: 07/13/2021    ECHOCARDIOGRAM REPORT   Patient Name:   Heidi Holt    Date of Exam: 07/12/2021 Medical Rec #:  [id number]     Height:       66.5 in Accession #:    [id number]    Weight:       205.4  lb Date of Birth:  July 19, 1960     BSA:          2.034 m Patient Age:    10 years      BP:           136/78 mmHg Patient Gender: F             HR:           71 bpm. Exam Location:  [address] Procedure: 2D Echo, 3D Echo, Cardiac Doppler, Color Doppler and Strain Analysis Indications:    R06.00 Dysnea  History:        Patient has no prior history of Echocardiogram examinations.                 Signs/Symptoms:Dyspnea, Palpitations/Elevated BNP and                 Hypotension; Risk Factors:Hypertension,  Dyslipidemia and Family                 History of Coronary Artery Disease.  Sonographer:    Lenard Galloway BA, RDCS Referring Phys: [place]  1. Left ventricular ejection fraction, by estimation, is 55 to 60%. The left ventricle has normal function. The left ventricle has no regional wall motion abnormalities. Left ventricular diastolic parameters are consistent with Grade I diastolic dysfunction (impaired relaxation). The average left ventricular global longitudinal strain is -20.9 %. The global longitudinal strain is normal.  2. Right ventricular systolic function is normal. The right ventricular size is normal. There is normal pulmonary artery systolic pressure.  3. Left atrial size was mildly dilated.  4. Right atrial size was mildly dilated.  5. The mitral valve is normal in structure. No evidence of mitral valve regurgitation. No evidence of mitral stenosis.  6. The aortic valve is tricuspid. Aortic valve regurgitation is trivial. No aortic stenosis is present.  7. The inferior vena cava is normal in size with greater than 50% respiratory variability, suggesting right atrial pressure of 3 mmHg. FINDINGS  Left Ventricle: Left ventricular ejection fraction, by estimation, is 55 to 60%. The left ventricle has normal function. The left ventricle has no regional wall motion abnormalities. The average left ventricular global longitudinal strain is -20.9 %. The global longitudinal strain is normal. The left ventricular internal cavity size was normal in size. There is no left ventricular hypertrophy. Left ventricular diastolic parameters are consistent with Grade I diastolic dysfunction (impaired relaxation). Indeterminate filling pressures. Right Ventricle: The right ventricular size is normal. No increase in right ventricular wall thickness. Right ventricular systolic function is normal. There is normal pulmonary artery systolic pressure. The tricuspid regurgitant velocity is 2.16 m/s,  and  with an assumed right atrial pressure of 3 mmHg, the estimated right ventricular systolic pressure is 0000000 mmHg. Left Atrium: Left atrial size was mildly dilated. Right Atrium: Right atrial size was mildly dilated. Pericardium: There is no evidence of pericardial effusion. Mitral Valve: The mitral valve is normal in structure. Mild mitral annular calcification. No evidence of mitral valve regurgitation. No evidence of mitral valve stenosis. Tricuspid Valve: The tricuspid valve is normal in structure. Tricuspid valve regurgitation is mild . No evidence of tricuspid stenosis. Aortic Valve: The aortic valve is tricuspid. Aortic valve regurgitation is trivial. No aortic stenosis is present. Pulmonic Valve: The pulmonic valve was normal in structure. Pulmonic valve regurgitation is not visualized. No evidence of pulmonic stenosis. Aorta: The aortic root is normal in size and structure. Venous: The inferior  vena cava is normal in size with greater than 50% respiratory variability, suggesting right atrial pressure of 3 mmHg. IAS/Shunts: No atrial level shunt detected by color flow Doppler.  LEFT VENTRICLE PLAX 2D LVIDd:         4.20 cm   Diastology LVIDs:         2.90 cm   LV e' medial:    7.51 cm/s LV PW:         1.00 cm   LV E/e' medial:  12.4 LV IVS:        0.90 cm   LV e' lateral:   15.40 cm/s LVOT diam:     2.10 cm   LV E/e' lateral: 6.1 LV SV:         74 LV SV Index:   37        2D Longitudinal Strain LVOT Area:     3.46 cm  2D Strain GLS (A2C):   -21.9 %                          2D Strain GLS (A3C):   -22.1 %                          2D Strain GLS (A4C):   -18.8 %                          2D Strain GLS Avg:     -20.9 %                           3D Volume EF:                          3D EF:        57 %                          LV EDV:       136 ml                          LV ESV:       58 ml                          LV SV:        78 ml RIGHT VENTRICLE             IVC RV Basal diam:  3.20 cm     IVC diam: 1.40  cm RV Mid diam:    2.40 cm RV S prime:     14.60 cm/s TAPSE (M-mode): 2.8 cm RVSP:           21.7 mmHg LEFT ATRIUM             Index        RIGHT ATRIUM LA diam:        4.30 cm 2.11 cm/m   RA Pressure: 3.00 mmHg LA Vol (A2C):   67.9 ml 33.38 ml/m LA Vol (A4C):   55.4 ml 27.24 ml/m LA Biplane Vol: 61.8 ml 30.38 ml/m  AORTIC VALVE LVOT Vmax:   101.00 cm/s LVOT Vmean:  69.200 cm/s LVOT VTI:    0.215 m  AORTA Ao Root diam: 3.10 cm Ao Asc  diam:  3.00 cm MITRAL VALVE                TRICUSPID VALVE MV Area (PHT): 3.54 cm     TR Peak grad:   18.7 mmHg MV Decel Time: 214 msec     TR Vmax:        216.00 cm/s MV E velocity: 93.20 cm/s   Estimated RAP:  3.00 mmHg MV A velocity: 123.00 cm/s  RVSP:           21.7 mmHg MV E/A ratio:  0.76                             SHUNTS                             Systemic VTI:  0.22 m                             Systemic Diam: 2.10 cm Chilton Si MD Electronically signed by Chilton Si MD Signature Date/Time: 07/13/2021/5:26:27 PM    Final     Disposition   Pt is being discharged home today in good condition.  Follow-up Plans & Appointments     Follow-up Information     Levi Aland, NP Follow up on 08/08/2021.   Specialty: Nurse Practitioner Why: at 10:50am for your follow up appt with Dr. Waverly Ferrari' NP Rande Brunt information: [address] [place] [address] [phone]                Discharge Instructions     AMB Referral to Advanced Lipid Disorders Clinic   Complete by: As directed    Internal Lipid Clinic Referral Scheduling  Internal lipid clinic referrals are providers within [facility], who wish to refer established patients for routine management (help in starting PCSK9 inhibitor therapy) or advanced therapies.  Internal MD referral criteria:              1. All patients with LDL>190 mg/dL  2. All patients with Triglycerides >500 mg/dL  3. Patients with suspected or confirmed heterozygous familial hyperlipidemia  (HeFH) or homozygous familial hyperlipidemia (HoFH)  4. Patients with family history of suspicious for genetic dyslipidemia desiring genetic testing  5. Patients refractory to standard guideline based therapy  6. Patients with statin intolerance (failed 2 statins, one of which must be a high potency statin)  7. Patients who the provider desires to be seen by MD   Internal PharmD referral criteria:   1. Follow-up patients for medication management  2. Follow-up for compliance monitoring  3. Patients for drug education  4. Patients with statin intolerance  5. PCSK9 inhibitor education and prior authorization approvals  6. Patients with triglycerides <500 mg/dL  External Lipid Clinic Referral  External lipid clinic referrals are for providers outside of [facility], considered new clinic patients - automatically routed to MD schedule   AMB Referral to Cardiac Rehabilitation - Phase II   Complete by: As directed    Diagnosis: PTCA   After initial evaluation and assessments completed: Virtual Based Care may be provided alone or in conjunction with Phase 2 Cardiac Rehab based on patient barriers.: Yes   Diet - low sodium heart healthy   Complete by: As directed    Discharge instructions   Complete by: As directed    Radial Site  Care Refer to this sheet in the next few weeks. These instructions provide you with information on caring for yourself after your procedure. Your caregiver may also give you more specific instructions. Your treatment has been planned according to current medical practices, but problems sometimes occur. Call your caregiver if you have any problems or questions after your procedure. HOME CARE INSTRUCTIONS You may shower the day after the procedure. Remove the bandage (dressing) and gently wash the site with plain soap and water. Gently pat the site dry.  Do not apply powder or lotion to the site.  Do not submerge the affected site in water for 3 to 5 days.  Inspect  the site at least twice daily.  Do not flex or bend the affected arm for 24 hours.  No lifting over 5 pounds (2.3 kg) for 5 days after your procedure.  Do not drive home if you are discharged the same day of the procedure. Have someone else drive you.  You may drive 24 hours after the procedure unless otherwise instructed by your caregiver.  What to expect: Any bruising will usually fade within 1 to 2 weeks.  Blood that collects in the tissue (hematoma) may be painful to the touch. It should usually decrease in size and tenderness within 1 to 2 weeks.  SEEK IMMEDIATE MEDICAL CARE IF: You have unusual pain at the radial site.  You have redness, warmth, swelling, or pain at the radial site.  You have drainage (other than a small amount of blood on the dressing).  You have chills.  You have a fever or persistent symptoms for more than 72 hours.  You have a fever and your symptoms suddenly get worse.  Your arm becomes pale, cool, tingly, or numb.  You have heavy bleeding from the site. Hold pressure on the site.   Increase activity slowly   Complete by: As directed         Discharge Medications   Allergies as of 07/28/2021       Reactions   Amlodipine Swelling   Crestor [rosuvastatin] Other (See Comments)   myalgias   Hycodan [hydrocodone Bit-homatrop Mbr] Other (See Comments)   "too strong"   Lipitor [atorvastatin] Other (See Comments)   myalgias   Methotrexate    Other reaction(s): headaches   Topamax [topiramate] Nausea And Vomiting   Latex Swelling, Rash        Medication List     STOP taking these medications    hydrochlorothiazide 25 MG tablet Commonly known as: HYDRODIURIL   NAPROXEN PO       TAKE these medications    Aspirin Low Dose 81 MG tablet Generic drug: aspirin EC Take 1 tablet (81 mg total) by mouth daily. Swallow whole.   citalopram 40 MG tablet Commonly known as: CELEXA Take 40 mg by mouth daily.   clopidogrel 75 MG tablet Commonly  known as: Plavix Take 1 tablet (75 mg total) by mouth daily.   fluticasone 50 MCG/ACT nasal spray Commonly known as: FLONASE Place 1 spray into both nostrils daily as needed for allergies or rhinitis.   GLUCOSAMINE-CHONDROIT-BIOFL-MN PO Take by mouth.   losartan 100 MG tablet Commonly known as: COZAAR Take 100 mg by mouth daily.   metoprolol tartrate 25 MG tablet Commonly known as: LOPRESSOR Take 0.5 tablets (12.5 mg total) by mouth 2 (two) times daily.   multivitamin with minerals tablet Take 1 tablet by mouth daily.   nitroGLYCERIN 0.4 MG SL tablet Commonly known as: Nitrostat  Place 1 tablet (0.4 mg total) under the tongue every 5 (five) minutes as needed for chest pain.   OVER THE COUNTER MEDICATION Take 6 mg by mouth daily. Balance of Nature Fruit  Vegetable   pantoprazole 40 MG tablet Commonly known as: PROTONIX TAKE 1 TABLET BY MOUTH BEFORE BREAKFAST . APPOINTMENT REQUIRED FOR FUTURE REFILLS   Pepcid AC Maximum Strength 20 MG tablet Generic drug: famotidine Take 20 mg by mouth at bedtime.   rizatriptan 10 MG tablet Commonly known as: Maxalt Take 1 tablet (10 mg total) by mouth 3 (three) times daily as needed for migraine. May repeat in 2 hours if needed           Allergies Allergies  Allergen Reactions   Amlodipine Swelling   Crestor [Rosuvastatin] Other (See Comments)    myalgias   Hycodan [Hydrocodone Bit-Homatrop Mbr] Other (See Comments)    "too strong"   Lipitor [Atorvastatin] Other (See Comments)    myalgias   Methotrexate     Other reaction(s): headaches   Topamax [Topiramate] Nausea And Vomiting   Latex Swelling and Rash    Outstanding Labs/Studies   Lipid clinic referral at discharge   Duration of Discharge Encounter   Greater than 30 minutes including physician time.  Signed, Reino Bellis, NP 07/28/2021, 2:04 PM  I have examined the patient and reviewed assessment and plan and discussed with patient.  Agree with above as  stated.  Catheter revealed widely patent left system with chronically occluded mid RCA.  There were brisk left to right collaterals.  We will try to treat the patient medically.  Unsuccessful attempt at crossing the chronic total occlusion.  If she has refractory symptoms, will bring her back for repeat intervention.  Continue aggressive secondary prevention including dual antiplatelet therapy with Plavix.  Larae Grooms

## 2021-07-28 NOTE — H&P (Signed)
Cardiology Admission History and Physical:   Patient ID: Heidi Holt MRN: [id number]; DOB: Apr 21, 1960   Admission date: 07/28/2021  PCP:  Tally Joe, MD   [facility] Providers Cardiologist:  Lance Muss, MD        Chief Complaint:  CAD  Patient Profile:   Heidi Holt is a 61 y.o. female with abnormal CTA.  History of Present Illness:   [name] who is being seen today for the evaluation of RF for CAD at the request of Tally Joe, MD.    She has had HTN and hyperlipidemia for at least 10 years.  BP has been well controlled.  Intolerant of statins (Lipitor she believes) due to psoriatic arthritis.     On a life insurance physical, BNP was elevated. About 375.     She has had some palpitations. She describes some shortness of breath.  Palpitations 4-5x/day, lasting 1 minute.  Father had MI at 6.  Dizziness with the palpitations.  No syncope. Walking up 2 flights of stairs will cause some SHOB- sx for 1 year.   CTA was abnormal.    Past Medical History:  Diagnosis Date   Allergy    Anxiety    Arthritis    Chronic kidney disease, stage 3a (HCC)    Common migraine with intractable migraine 02/20/2020   Depression    Gallstones    GERD (gastroesophageal reflux disease)    History of COVID-19 2019   HTN (hypertension)    Hypercholesterolemia    Hypertension    Kidney stone    Psoriasis    Thyroid disease    hx of hypo, pt states has resolved, no meds needed    Past Surgical History:  Procedure Laterality Date   ABDOMINAL HYSTERECTOMY     BREAST LUMPECTOMY     left   CHOLECYSTECTOMY     COLONOSCOPY     UPPER GASTROINTESTINAL ENDOSCOPY       Medications Prior to Admission: Prior to Admission medications   Medication Sig Start Date End Date Taking? Authorizing Provider  citalopram (CELEXA) 40 MG tablet Take 40 mg by mouth daily.   Yes [provider]  famotidine (PEPCID AC MAXIMUM STRENGTH) 20 MG tablet Take 20 mg by mouth at bedtime.   Yes  [provider]  fluticasone (FLONASE) 50 MCG/ACT nasal spray Place 1 spray into both nostrils daily as needed for allergies or rhinitis.   Yes [provider]  hydrochlorothiazide (HYDRODIURIL) 25 MG tablet Take 25 mg by mouth every morning. 06/12/21  Yes [provider]  losartan (COZAAR) 100 MG tablet Take 100 mg by mouth daily. 01/14/20  Yes [provider]  Multiple Vitamins-Minerals (MULTIVITAMIN WITH MINERALS) tablet Take 1 tablet by mouth daily.   Yes [provider]  NAPROXEN PO Take 400 mg by mouth daily as needed (Psoriatic Arthritis).   Yes [provider]  OVER THE COUNTER MEDICATION Take 6 mg by mouth daily. Balance of [employer]   Yes [provider]  pantoprazole (PROTONIX) 40 MG tablet TAKE 1 TABLET BY MOUTH BEFORE BREAKFAST . APPOINTMENT REQUIRED FOR FUTURE REFILLS 03/18/18  Yes Iva Boop, MD  GLUCOSAMINE-CHONDROIT-BIOFL-MN PO Take by mouth.    [provider]  rizatriptan (MAXALT) 10 MG tablet Take 1 tablet (10 mg total) by mouth 3 (three) times daily as needed for migraine. May repeat in 2 hours if needed 02/20/20   York Spaniel, MD     Allergies:  Allergies  Allergen Reactions   Amlodipine Swelling   Crestor [Rosuvastatin] Other (See Comments)    myalgias   Hycodan [Hydrocodone Bit-Homatrop Mbr] Other (See Comments)    "too strong"   Lipitor [Atorvastatin] Other (See Comments)    myalgias   Methotrexate     Other reaction(s): headaches   Topamax [Topiramate] Nausea And Vomiting   Latex Swelling and Rash    Social History:   Social History   Socioeconomic History   Marital status: Married    Spouse name: Harvie Heck   Number of children: 2   Years of education: two years of college   Highest education level: Not on file  Occupational History   Occupation: Retired  Tobacco Use   Smoking status: Never   Smokeless tobacco: Never  Substance and Sexual Activity   Alcohol  use: Yes    Alcohol/week: 0.0 standard drinks of alcohol    Comment: occasionally - rare   Drug use: Never   Sexual activity: Not on file  Other Topics Concern   Not on file  Social History Narrative   Lives at home with husband.   Right-handed.   Caffeine use: one cup coffee, sometime one Coke Zero.   Social Determinants of Health   Financial Resource Strain: Not on file  Food Insecurity: Not on file  Transportation Needs: Not on file  Physical Activity: Not on file  Stress: Not on file  Social Connections: Not on file  Intimate Partner Violence: Not on file    Family History:   The patient's family history includes Asthma in her mother; Bell's palsy in her maternal aunt; Diabetes in her mother; Heart attack (age of onset: 51) in her father; Heart attack (age of onset: 62) in her mother; Heart disease in her father and mother; Hypertension in her father, maternal grandfather, and mother; Seizures in her father; Stomach cancer in her maternal grandmother; Stroke in her paternal grandmother. There is no history of Colon cancer, Esophageal cancer, or Rectal cancer.    ROS:  Please see the history of present illness.  DOE.All other ROS reviewed and negative.     Physical Exam/Data:   Vitals:   07/28/21 0830  BP: 110/75  Pulse: 78  Resp: 18  Temp: 98.5 F (36.9 C)  TempSrc: Oral  SpO2: 96%  Weight: 89.8 kg  Height: 5\' 7"  (1.702 m)   No intake or output data in the 24 hours ending 07/28/21 0924    07/28/2021    8:30 AM 07/25/2021    2:09 PM 06/23/2021    1:38 PM  Last 3 Weights  Weight (lbs) 198 lb 208 lb 6.4 oz 205 lb 6.4 oz  Weight (kg) 89.812 kg 94.53 kg 93.169 kg     Body mass index is 31.01 kg/m.  General:  Well nourished, well developed, in no acute distress HEENT: normal Neck: no JVD Vascular: No carotid bruits; Distal pulses 2+ bilaterally   Cardiac:  normal S1, S2; RRR; no murmur  Lungs:  clear to auscultation bilaterally, no wheezing, rhonchi or rales   Abd: soft, nontender, no hepatomegaly  Ext: no edema Musculoskeletal:  No deformities, BUE and BLE strength normal and equal Skin: warm and dry  Neuro:  CNs 2-12 intact, no focal abnormalities noted Psych:  Normal affect      Relevant CV Studies: Abnormal Coronary CTA  Laboratory Data:  High Sensitivity Troponin:  No results for input(s): "TROPONINIHS" in the last 720 hours.    Chemistry Recent Labs  Lab  07/25/21 1359  NA 143  K 3.9  CL 105  CO2 20  GLUCOSE 73  BUN 32*  CREATININE 0.90  CALCIUM 9.5    No results for input(s): "PROT", "ALBUMIN", "AST", "ALT", "ALKPHOS", "BILITOT" in the last 168 hours. Lipids No results for input(s): "CHOL", "TRIG", "HDL", "LABVLDL", "LDLCALC", "CHOLHDL" in the last 168 hours. Hematology Recent Labs  Lab 07/25/21 1359  WBC 8.1  RBC 3.59*  HGB 11.4  HCT 33.3*  MCV 93  MCH 31.8  MCHC 34.2  RDW 11.7  PLT 240   Thyroid No results for input(s): "TSH", "FREET4" in the last 168 hours. BNPNo results for input(s): "BNP", "PROBNP" in the last 168 hours.  DDimer No results for input(s): "DDIMER" in the last 168 hours.   Radiology/Studies:  No results found.   Assessment and Plan:   CAD: PLan for cath. All questions answered.  The patient understands that risks include but are not limited to stroke (1 in 1000), death (1 in 1000), kidney failure [usually temporary] (1 in 500), bleeding (1 in 200), allergic reaction [possibly serious] (1 in 200), and agrees to proceed.     Risk Assessment/Risk Scores:        Severity of Illness: Cath Lab Visit (complete for each Cath Lab visit)  Clinical Evaluation Leading to the Procedure:   ACS: No.  Non-ACS:    Anginal Classification: CCS III  Anti-ischemic medical therapy: Minimal Therapy (1 class of medications)  Non-Invasive Test Results: High-risk stress test findings: cardiac mortality >3%/year  Prior CABG: No previous CABG         For questions or updates, please  contact [facility] Please consult [email or web address] for contact info under     Signed, Lance Muss, MD  07/28/2021 9:24 AM

## 2021-07-29 ENCOUNTER — Encounter (HOSPITAL_COMMUNITY): Payer: Self-pay | Admitting: Interventional Cardiology

## 2021-08-01 ENCOUNTER — Encounter (HOSPITAL_COMMUNITY): Payer: Self-pay

## 2021-08-01 NOTE — Progress Notes (Unsigned)
Patient ID: Heidi Holt                 DOB: 11-11-1960                    MRN: [id number]     HPI: Heidi Holt is a 61 y.o. female patient referred to lipid clinic by Dr. Eldridge Holt. PMH is significant for CAD, HTN, HLD, palpitations, SOB. Patient had coronary CT on 07/18/21 which showed calcium score of 588 (99th percentile for age- and sex-matched control) and total occlusion of the mid RCA confirmed by CT FFR. Analysis also showed significant FFR in distal LAD of 0.75. Patient then had LHC on 07/28/21 which showed 100% stenosis of mid RCA and brisk left to right collaterals. Attempt at PCI of mid RCA was unsuccessful. LVEF was 55-65%. Medical therapy with DAPT recommended and management of risk factors. She was referred to lipid clinic at that time as she has been unable to tolerate multiple statins.   Today, patient arrives in good spirits. Reports she has tried multiple statins in the past and they caused flares of her psoriatic arthritis and muscle pain. She thinks she has been on Lipitor and Crestor in the past. She does not remember the doses she was on but 40 mg sounds familiar to her. Her insurance plan is self pay so branded medications have been very expensive for her in the past. States she has a grandson and she would like to be around for a while so she is motivated to improve her cholesterol.   Current Medications: none  Intolerances: atorvastatin, rosuvastatin - unknown doses (myalgias) Risk Factors: CAD, HTN, HLD, extensive Fhx of premature ASCVD  LDL goal: <70 mg/dL  Diet:  -Breakfast: 2 scrambled eggs, toast, grits or bacon, fruit -Lunch: Usually skips, may have protein bar as a snack (Atkins), occasional [place] sandwich with baked Cheetos -Dinner: [place], [place], seafood. Soup, meatloaf. Usually avoids red meat but will have lean ground beef.  -Dessert: Usually just eats around holidays -Drinks: Usually just water, may have a small Sprite if she does have a soda  Exercise: Has  been feeling more fatigued in the past few months. Used to walk 6-7 miles on the treadmill or track. Has a swimming pool that she used to swim laps in. Tries to walk up and down the driveway but gets out of breath easily.   Family History: The patient's family history includes Asthma in her mother; Bell's palsy in her maternal aunt; Diabetes in her mother; Heart attack (age of onset: 26 - died) in her father; Heart attack (age of onset: 24 - died) in her mother; Heart disease in her father and mother; Hypertension in her father, maternal grandfather, and mother; Seizures in her father; Stomach cancer in her maternal grandmother; troke in her Maternal grandmother died of stroke and stomach cancer at age 47. Maternal grandfather died of heart attack in 51s.   Social History: The patient  reports that she has never smoked. She has never used smokeless tobacco. She reports current alcohol use. She reports that she does not use drugs.   Labs: -04/21/21: TC 209, HDL 40, TG 211, LDL 131 (no lipid lowering medications) -Highest LDL recently per [facility] was 160 in 2020  Past Medical History:  Diagnosis Date   Allergy    Anxiety    Arthritis    Chronic kidney disease, stage 3a (HCC)    Common migraine with intractable migraine 02/20/2020   Depression  Gallstones    GERD (gastroesophageal reflux disease)    History of COVID-19 2019   HTN (hypertension)    Hypercholesterolemia    Hypertension    Kidney stone    Psoriasis    Thyroid disease    hx of hypo, pt states has resolved, no meds needed   Current Outpatient Medications on File Prior to Visit  Medication Sig Dispense Refill   aspirin EC 81 MG tablet Take 1 tablet (81 mg total) by mouth daily. Swallow whole. 90 tablet 3   citalopram (CELEXA) 40 MG tablet Take 40 mg by mouth daily.     clopidogrel (PLAVIX) 75 MG tablet Take 1 tablet (75 mg total) by mouth daily. 90 tablet 3   famotidine (PEPCID AC MAXIMUM STRENGTH) 20 MG tablet Take 20 mg by mouth  at bedtime.     fluticasone (FLONASE) 50 MCG/ACT nasal spray Place 1 spray into both nostrils daily as needed for allergies or rhinitis.     GLUCOSAMINE-CHONDROIT-BIOFL-MN PO Take by mouth.     losartan (COZAAR) 100 MG tablet Take 100 mg by mouth daily.     metoprolol tartrate (LOPRESSOR) 25 MG tablet Take 0.5 tablets (12.5 mg total) by mouth 2 (two) times daily. 30 tablet 11   Multiple Vitamins-Minerals (MULTIVITAMIN WITH MINERALS) tablet Take 1 tablet by mouth daily.     nitroGLYCERIN (NITROSTAT) 0.4 MG SL tablet Place 1 tablet (0.4 mg total) under the tongue every 5 (five) minutes as needed for chest pain. 25 tablet 0   OVER THE COUNTER MEDICATION Take 6 mg by mouth daily. Balance of Nature Fruit  Vegetable     pantoprazole (PROTONIX) 40 MG tablet TAKE 1 TABLET BY MOUTH BEFORE BREAKFAST . APPOINTMENT REQUIRED FOR FUTURE REFILLS 90 tablet 0   rizatriptan (MAXALT) 10 MG tablet Take 1 tablet (10 mg total) by mouth 3 (three) times daily as needed for migraine. May repeat in 2 hours if needed 10 tablet 3   No current facility-administered medications on file prior to visit.   Allergies  Allergen Reactions   Amlodipine Swelling   Crestor [Rosuvastatin] Other (See Comments)    myalgias   Hycodan [Hydrocodone Bit-Homatrop Mbr] Other (See Comments)    "too strong"   Lipitor [Atorvastatin] Other (See Comments)    myalgias   Methotrexate     Other reaction(s): headaches   Topamax [Topiramate] Nausea And Vomiting   Latex Swelling and Rash   Assessment/Plan:  1. Hyperlipidemia - LDL is above goal <70 mg/dL. After discussion of medication options patient would like to retrial rosuvastatin at a lower dose. Ideally needs high intensity dosing if she could tolerate it, and she thinks she was on 40 mg in the past when she had myalgias. She is agreeable to starting rosuvastatin at 20 mg daily. Counseled the patient that if she experiences myalgias on this dose, to wait a few days to let it wash out,  then resume at 10 mg (1/2 tablet). If she still has myalgias on that dose, she will call the office and we can try 5 mg daily or even three times weekly. Counseled patient on lifestyle modifications including eating a healthy diet and increasing exercise as able, which should help improve elevated triglycerides.   Pervis Hocking, PharmD PGY2 Ambulatory Care Pharmacy Resident 08/02/2021 2:40 PM

## 2021-08-02 ENCOUNTER — Ambulatory Visit (INDEPENDENT_AMBULATORY_CARE_PROVIDER_SITE_OTHER): Payer: 59 | Admitting: Student-PharmD

## 2021-08-02 DIAGNOSIS — I2510 Atherosclerotic heart disease of native coronary artery without angina pectoris: Secondary | ICD-10-CM | POA: Diagnosis not present

## 2021-08-02 MED ORDER — ROSUVASTATIN CALCIUM 20 MG PO TABS: 20.0000 mg | ORAL_TABLET | Freq: Every day | ORAL | 3 refills | Status: DC

## 2021-08-02 NOTE — Patient Instructions (Addendum)
Nice to see you today!  Keep up the good work with diet and exercise. Aim for a diet full of vegetables, fruit and lean meats (chicken, [place], fish). Try to limit carbs (bread, pasta, sugar, rice) and red meat consumption.  Your goal LDL is less than 70 mg/dL, you're currently at 446 mg/dL  Medication Changes: Begin rosuvastatin 20 mg (1 tablet) daily. If you have issues on that dose, hold the dose until it resolves, then resume at 1/2 tablet (10 mg) daily. If you have problems on that dose as well, give [place] a call: [phone].   Your follow up labs are scheduled for Tuesday, August 1st. The lab opens at 7:30am and you can come any time after that as long as you are fasting.

## 2021-08-07 NOTE — Progress Notes (Deleted)
Cardiology Office Note:    Date:  08/07/2021   ID:  Heidi Holt, DOB 02/08/1961, MRN [id number]  PCP:  Tally Joe, MD   [facility] Providers Cardiologist:  Lance Muss, MD { Click to update primary MD,subspecialty MD or APP then REFRESH:1}    Referring MD: Tally Joe, MD   Chief Complaint: ***  History of Present Illness:    Heidi Holt is a *** 61 y.o. female with a hx of CAD, hypertension, hyperlipidemia, palpitations, and shortness of breath.   She was referred to our practice by for evaluation of risk factors for CAD and seen on 06/23/2021 by Dr. Eldridge Dace.  Previously managed by PCP for hypertension and hyperlipidemia for approximately 10 years.  Reported having palpitations and some shortness of breath.  Palpitations 4-5 times per day lasting approximately 1 minute accompanied by dizziness.  No syncope.  1 year history of shortness of breath with walking up 2 flights of stairs. Father had MI at 53.  Echocardiogram revealed normal LVEF 55 to 60%, grade 1 diastolic dysfunction, normal RV. Coronary CT on 07/18/2021 which showed calcium score 588 (99th percentile) and total occlusion of the mid RCA confirmed by CT FFR.  Analysis also revealed significant FFR and distal LAD of 0.75.  Patient underwent LHC on 07/28/2021 which showed 100% stenosis of mid RCA and brisk left to right collaterals.  Attempt at PCA of mid RCA was unsuccessful.  LVEF 55 to 65%.  Medical therapy with DAPT recommended and management of risk factors.  She has been intolerant of multiple statins.  She was referred to lipid clinic and seen by medicine Yates, our pH on 08/02/2021.  Symptoms of statin intolerance include flares of psoriatic arthritis and muscle pain.  She is self-pay so branded medications have been expensive.  She will trial rosuvastatin 20 mg daily.  If she does not tolerate she will wait a few days to let it wash out, then resume 10 mg daily.  If she continues to have symptoms at that dose she  will call the office for discussion of 5 mg daily or 10 mg 3 times a week.  Today, she is here   Past Medical History:  Diagnosis Date   Allergy    Anxiety    Arthritis    Chronic kidney disease, stage 3a (HCC)    Common migraine with intractable migraine 02/20/2020   Depression    Gallstones    GERD (gastroesophageal reflux disease)    History of COVID-19 2019   HTN (hypertension)    Hypercholesterolemia    Hypertension    Kidney stone    Psoriasis    Thyroid disease    hx of hypo, pt states has resolved, no meds needed    Past Surgical History:  Procedure Laterality Date   ABDOMINAL HYSTERECTOMY     BREAST LUMPECTOMY     left   CHOLECYSTECTOMY     COLONOSCOPY     LEFT HEART CATH AND CORONARY ANGIOGRAPHY N/A 07/28/2021   Procedure: LEFT HEART CATH AND CORONARY ANGIOGRAPHY;  Surgeon: Corky Crafts, MD;  Location: [facility] INVAS[facility];  Service: Cardiovascular;  Laterality: N/A;   UPPER GASTROINTESTINAL ENDOSCOPY      Current Medications: No outpatient medications have been marked as taking for the 08/08/21 encounter (Appointment) with Lissa Hoard, Zachary George, NP.     Allergies:   Amlodipine, Crestor [rosuvastatin], Hycodan [hydrocodone bit-homatrop mbr], Lipitor [atorvastatin], Methotrexate, Topamax [topiramate], and Latex   Social History   Socioeconomic History  Marital status: Married    Spouse name: Louie Casa   Number of children: 2   Years of education: two years of college   Highest education level: Not on file  Occupational History   Occupation: Retired  Tobacco Use   Smoking status: Never   Smokeless tobacco: Never  Substance and Sexual Activity   Alcohol use: Yes    Alcohol/week: 0.0 standard drinks of alcohol    Comment: occasionally - rare   Drug use: Never   Sexual activity: Not on file  Other Topics Concern   Not on file  Social History Narrative   Lives at home with husband.   Right-handed.   Caffeine use: one cup coffee, sometime one Coke  Zero.   Social Determinants of Health   Financial Resource Strain: Not on file  Food Insecurity: Not on file  Transportation Needs: Not on file  Physical Activity: Not on file  Stress: Not on file  Social Connections: Not on file     Family History: The patient's ***family history includes Asthma in her mother; Bell's palsy in her maternal aunt; Diabetes in her mother; Heart attack (age of onset: 68) in her father; Heart attack (age of onset: 47) in her mother; Heart disease in her father and mother; Hypertension in her father, maternal grandfather, and mother; Seizures in her father; Stomach cancer in her maternal grandmother; Stroke in her paternal grandmother. There is no history of Colon cancer, Esophageal cancer, or Rectal cancer.  ROS:   Please see the history of present illness.    *** All other systems reviewed and are negative.  Labs/Other Studies Reviewed:    The following studies were reviewed today:  LHC 07/28/21    Mid RCA lesion is 100% stenosed.  Brisk left to right collaterals.  This was a chronic total occlusion.  Attempt at PCI of the mid RCA was unsuccessful despite using multiple wires.  Severe right subclavian tortuosity necessitated using an 85 cm destination sheath.  Support from the right radial approach was challenging.   Ost RCA to Prox RCA lesion is 25% stenosed.   The left ventricular systolic function is normal.   LV end diastolic pressure is normal.   The left ventricular ejection fraction is 55-65% by visual estimate.   There is no aortic valve stenosis.   Single-vessel CAD with a chronic total occlusion of the mid RCA.  Brisk left to right collaterals.  Left system is widely patent.  Normal LV function.  Normal LVEDP.  Continue medical therapy.  If she had refractory symptoms, could consider intervention from the femoral approach for better support.   Echo 07/13/21  1. Left ventricular ejection fraction, by estimation, is 55 to 60%. The  left  ventricle has normal function. The left ventricle has no regional  wall motion abnormalities. Left ventricular diastolic parameters are  consistent with Grade I diastolic  dysfunction (impaired relaxation). The average left ventricular global  longitudinal strain is -20.9 %. The global longitudinal strain is normal.   2. Right ventricular systolic function is normal. The right ventricular  size is normal. There is normal pulmonary artery systolic pressure.   3. Left atrial size was mildly dilated.   4. Right atrial size was mildly dilated.   5. The mitral valve is normal in structure. No evidence of mitral valve  regurgitation. No evidence of mitral stenosis.   6. The aortic valve is tricuspid. Aortic valve regurgitation is trivial.  No aortic stenosis is present.   7. The  inferior vena cava is normal in size with greater than 50%  respiratory variability, suggesting right atrial pressure of 3 mmHg.   Recent Labs: 07/25/2021: BUN 32; Creatinine, Ser 0.90; Hemoglobin 11.4; Platelets 240; Potassium 3.9; Sodium 143  Recent Lipid Panel No results found for: "CHOL", "TRIG", "HDL", "CHOLHDL", "VLDL", "LDLCALC", "LDLDIRECT"   Risk Assessment/Calculations:   {Does this patient have ATRIAL FIBRILLATION?:[phone]}       Physical Exam:    VS:  There were no vitals taken for this visit.    Wt Readings from Last 3 Encounters:  07/28/21 198 lb (89.8 kg)  07/25/21 208 lb 6.4 oz (94.5 kg)  06/23/21 205 lb 6.4 oz (93.2 kg)     GEN: *** Well nourished, well developed in no acute distress HEENT: Normal NECK: No JVD; No carotid bruits CARDIAC: ***RRR, no murmurs, rubs, gallops RESPIRATORY:  Clear to auscultation without rales, wheezing or rhonchi  ABDOMEN: Soft, non-tender, non-distended MUSCULOSKELETAL:  No edema; No deformity. *** pedal pulses, ***bilaterally SKIN: Warm and dry NEUROLOGIC:  Alert and oriented x 3 PSYCHIATRIC:  Normal affect   EKG:  EKG is *** ordered today.  The ekg  ordered today demonstrates ***  Diagnoses:    1. Coronary artery disease involving native coronary artery of native heart without angina pectoris   2. Hyperlipidemia LDL goal <70   3. Essential hypertension   4. DOE (dyspnea on exertion)    Assessment and Plan:     CAD  Hyperlipidemia LDL goal < 70: Hypertension: Palpitations: DOE:   {The patient has an active order for outpatient cardiac rehabilitation.   Please indicate if the patient is ready to start. Do NOT delete this.  It will auto delete.  Refresh note, then sign.              Click here to document readiness and see contraindications.  :1}  Cardiac Rehabilitation Eligibility Assessment       {The patient has an active order for outpatient cardiac rehabilitation.   Please indicate if the patient is ready to start. Do NOT delete this.  It will auto delete.  Refresh note, then sign.              Click here to document readiness and see contraindications.  :1}  Cardiac Rehabilitation Eligibility Assessment       {Are you ordering a CV Procedure (e.g. stress test, cath, DCCV, TEE, etc)?   Press F2        :[id number]}    Medication Adjustments/Labs and Tests Ordered: Current medicines are reviewed at length with the patient today.  Concerns regarding medicines are outlined above.  No orders of the defined types were placed in this encounter.  No orders of the defined types were placed in this encounter.   There are no Patient Instructions on file for this visit.   Signed, Levi Aland, NP  08/07/2021 5:31 PM    [facility]

## 2021-08-08 ENCOUNTER — Telehealth: Payer: Self-pay | Admitting: Interventional Cardiology

## 2021-08-08 ENCOUNTER — Ambulatory Visit: Payer: 59 | Admitting: Nurse Practitioner

## 2021-08-08 DIAGNOSIS — E785 Hyperlipidemia, unspecified: Secondary | ICD-10-CM

## 2021-08-08 DIAGNOSIS — I10 Essential (primary) hypertension: Secondary | ICD-10-CM

## 2021-08-08 DIAGNOSIS — R0609 Other forms of dyspnea: Secondary | ICD-10-CM

## 2021-08-08 DIAGNOSIS — I2510 Atherosclerotic heart disease of native coronary artery without angina pectoris: Secondary | ICD-10-CM

## 2021-08-08 NOTE — Telephone Encounter (Signed)
Spoke with pt and advised appointment scheduled for today as for post heart cath follow up.  Pt apologized for needing to cancel but states her arthritis is flaring today and is very painful.  Attempted to assist pt with rescheduling appointment but could not find anything sooner than the end of July.  Pt advised will forward to scheduling to assist with finding sooner appointment.  Pt verbalizes understanding and thanked Charity fundraiser for the call.

## 2021-08-25 ENCOUNTER — Encounter: Payer: Self-pay | Admitting: Physician Assistant

## 2021-08-25 ENCOUNTER — Ambulatory Visit (INDEPENDENT_AMBULATORY_CARE_PROVIDER_SITE_OTHER): Payer: Commercial Managed Care - PPO | Admitting: Physician Assistant

## 2021-08-25 VITALS — BP 134/64 | HR 81 | Ht 67.0 in | Wt 213.4 lb

## 2021-08-25 DIAGNOSIS — E782 Mixed hyperlipidemia: Secondary | ICD-10-CM | POA: Diagnosis not present

## 2021-08-25 DIAGNOSIS — I10 Essential (primary) hypertension: Secondary | ICD-10-CM | POA: Diagnosis not present

## 2021-08-25 DIAGNOSIS — I2510 Atherosclerotic heart disease of native coronary artery without angina pectoris: Secondary | ICD-10-CM | POA: Diagnosis not present

## 2021-08-25 MED ORDER — HYDROCHLOROTHIAZIDE 25 MG PO TABS: 25.0000 mg | ORAL_TABLET | Freq: Every day | ORAL | 1 refills | Status: DC

## 2021-08-25 NOTE — Progress Notes (Signed)
Cardiology Office Note:    Date:  08/25/2021   ID:  Heidi Holt, DOB [birth year], MRN [id number]  PCP:  Heidi Contras, MD  [facility] Cardiologist:  Heidi Grooms, MD  [facility] Electrophysiologist:  None   Chief Complaint: post cath follow up   History of Present Illness:    Heidi Holt is a 61 y.o. female with a hx of CAD, hypertension, Psoriatic arthritis (on Humira),  hyperlipidemia and hypothyroidism seen for cath follow up.   Recently seen by Dr. Irish Holt for chest pain & dyspnea on exertion.  Strong family history of CAD with father having MI at age 56. Underwent outpatient coronary CTA which showed CTO of the mid RCA with collateral flow.   Cath with single-vessel CAD with CTO of mid RCA, brisk left-to-right collaterals.  Unsuccessful attempt at PCI of the mid RCA despite using multiple wires.. Plan for DAPT with ASA/plavix for now. Maybe bring back for CTO PCI attempt in the future.  Metoprolol 12.5mg  BID, asked that she stop HCTZ for now. Lipid clinic referral as she is statin intoleran.  Here today for follow up.  Since discontinuation of hydrochlorothiazide she has gained 8 pounds.  Has noted lower extremity edema and hand swelling.  Some orthopnea and shortness of breath.  She would like to go back on hydrochlorothiazide.  No chest pain.  She continues to have dyspnea on exertion and fatigue.  She only takes metoprolol heart rate once daily.  Past Medical History:  Diagnosis Date   Allergy    Anxiety    Arthritis    Chronic kidney disease, stage 3a ([place])    Common migraine with intractable migraine 02/20/2020   Depression    Gallstones    GERD (gastroesophageal reflux disease)    History of COVID-19 2019   HTN (hypertension)    Hypercholesterolemia    Hypertension    Kidney stone    Psoriasis    Thyroid disease    hx of hypo, pt states has resolved, no meds needed    Past Surgical History:  Procedure Laterality Date   ABDOMINAL HYSTERECTOMY     BREAST  LUMPECTOMY     left   CHOLECYSTECTOMY     COLONOSCOPY     LEFT HEART CATH AND CORONARY ANGIOGRAPHY N/A 07/28/2021   Procedure: LEFT HEART CATH AND CORONARY ANGIOGRAPHY;  Surgeon: Heidi Booze, MD;  Location: [place] [facility];  Service: Cardiovascular;  Laterality: N/A;   UPPER GASTROINTESTINAL ENDOSCOPY      Current Medications: Current Meds  Medication Sig   aspirin EC 81 MG tablet Take 1 tablet (81 mg total) by mouth daily. Swallow whole.   citalopram (CELEXA) 40 MG tablet Take 40 mg by mouth daily.   clopidogrel (PLAVIX) 75 MG tablet Take 1 tablet (75 mg total) by mouth daily.   famotidine (PEPCID AC MAXIMUM STRENGTH) 20 MG tablet Take 20 mg by mouth at bedtime.   fluticasone (FLONASE) 50 MCG/ACT nasal spray Place 1 spray into both nostrils daily as needed for allergies or rhinitis.   GLUCOSAMINE-CHONDROIT-BIOFL-MN PO Take 60 mg by mouth daily at 12 noon.   hydrochlorothiazide (HYDRODIURIL) 25 MG tablet Take 1 tablet (25 mg total) by mouth daily.   losartan (COZAAR) 100 MG tablet Take 100 mg by mouth daily.   metoprolol tartrate (LOPRESSOR) 25 MG tablet Take 0.5 tablets (12.5 mg total) by mouth 2 (two) times daily.   Multiple Vitamins-Minerals (MULTIVITAMIN WITH MINERALS) tablet Take 1 tablet by mouth daily.  nitroGLYCERIN (NITROSTAT) 0.4 MG SL tablet Place 1 tablet (0.4 mg total) under the tongue every 5 (five) minutes as needed for chest pain.   OVER THE COUNTER MEDICATION Take 6 mg by mouth daily. Balance of Nature Fruit  Vegetable   pantoprazole (PROTONIX) 40 MG tablet TAKE 1 TABLET BY MOUTH BEFORE BREAKFAST . APPOINTMENT REQUIRED FOR FUTURE REFILLS   rizatriptan (MAXALT) 10 MG tablet Take 1 tablet (10 mg total) by mouth 3 (three) times daily as needed for migraine. May repeat in 2 hours if needed   rosuvastatin (CRESTOR) 20 MG tablet Take 1 tablet (20 mg total) by mouth daily.     Allergies:   Amlodipine, Crestor [rosuvastatin], Hycodan [hydrocodone bit-homatrop mbr],  Lipitor [atorvastatin], Methotrexate, Topamax [topiramate], and Latex   Social History   Socioeconomic History   Marital status: Married    Spouse name: Heidi Holt   Number of children: 2   Years of education: two years of college   Highest education level: Not on file  Occupational History   Occupation: Retired  Tobacco Use   Smoking status: Never   Smokeless tobacco: Never  Substance and Sexual Activity   Alcohol use: Yes    Alcohol/week: 0.0 standard drinks of alcohol    Comment: occasionally - rare   Drug use: Never   Sexual activity: Not on file  Other Topics Concern   Not on file  Social History Narrative   Lives at home with husband.   Right-handed.   Caffeine use: one cup coffee, sometime one Coke Zero.   Social Determinants of Health   Financial Resource Strain: Not on file  Food Insecurity: Not on file  Transportation Needs: Not on file  Physical Activity: Not on file  Stress: Not on file  Social Connections: Not on file     Family History: The patient's family history includes Asthma in her mother; Bell's palsy in her maternal aunt; Diabetes in her mother; Heart attack (age of onset: 1) in her father; Heart attack (age of onset: 55) in her mother; Heart disease in her father and mother; Hypertension in her father, maternal grandfather, and mother; Seizures in her father; Stomach cancer in her maternal grandmother; Stroke in her paternal grandmother. There is no history of Colon cancer, Esophageal cancer, or Rectal cancer.    ROS:   Please see the history of present illness.    All other systems reviewed and are negative.   EKGs/Labs/Other Studies Reviewed:    The following studies were reviewed today: Cardiac Catheterization 07/28/2021:     Mid RCA lesion is 100% stenosed.  Brisk left to right collaterals.  This was a chronic total occlusion.  Attempt at PCI of the mid RCA was unsuccessful despite using multiple wires.  Severe right subclavian tortuosity  necessitated using an 85 cm destination sheath.  Support from the right radial approach was challenging.   Ost RCA to Prox RCA lesion is 25% stenosed.   The left ventricular systolic function is normal.   LV end diastolic pressure is normal.   The left ventricular ejection fraction is 55-65% by visual estimate.   There is no aortic valve stenosis.   Single-vessel CAD with a chronic total occlusion of the mid RCA.  Brisk left to right collaterals.  Left system is widely patent.  Normal LV function.  Normal LVEDP.  Continue medical therapy.  If she had refractory symptoms, could consider intervention from the femoral approach for better support.   Diagnostic Dominance: Right  Echo 06/2021 1.  Left ventricular ejection fraction, by estimation, is 55 to 60%. The  left ventricle has normal function. The left ventricle has no regional  wall motion abnormalities. Left ventricular diastolic parameters are  consistent with Grade I diastolic  dysfunction (impaired relaxation). The average left ventricular global  longitudinal strain is -20.9 %. The global longitudinal strain is normal.   2. Right ventricular systolic function is normal. The right ventricular  size is normal. There is normal pulmonary artery systolic pressure.   3. Left atrial size was mildly dilated.   4. Right atrial size was mildly dilated.   5. The mitral valve is normal in structure. No evidence of mitral valve  regurgitation. No evidence of mitral stenosis.   6. The aortic valve is tricuspid. Aortic valve regurgitation is trivial.  No aortic stenosis is present.   7. The inferior vena cava is normal in size with greater than 50%  respiratory variability, suggesting right atrial pressure of 3 mmHg.    EKG:  EKG is not  ordered today.   Recent Labs: 07/25/2021: BUN 32; Creatinine, Ser 0.90; Hemoglobin 11.4; Platelets 240; Potassium 3.9; Sodium 143  Recent Lipid Panel No results found for: "CHOL", "TRIG", "HDL", "CHOLHDL",  "VLDL", "LDLCALC", "LDLDIRECT"    Physical Exam:    VS:  BP 134/64   Pulse 81   Ht 5\' 7"  (1.702 m)   Wt 213 lb 6.4 oz (96.8 kg)   SpO2 98%   BMI 33.42 kg/m     Wt Readings from Last 3 Encounters:  08/25/21 213 lb 6.4 oz (96.8 kg)  07/28/21 198 lb (89.8 kg)  07/25/21 208 lb 6.4 oz (94.5 kg)     GEN:  Well nourished, well developed in no acute distress HEENT: Normal NECK: No JVD; No carotid bruits LYMPHATICS: No lymphadenopathy CARDIAC: RRR, no murmurs, rubs, gallops RESPIRATORY:  Clear to auscultation without rales, wheezing or rhonchi  ABDOMEN: Soft, non-tender, non-distended MUSCULOSKELETAL:  No edema; No deformity  SKIN: Warm and dry NEUROLOGIC:  Alert and oriented x 3 PSYCHIATRIC:  Normal affect   ASSESSMENT AND PLAN:   CAD Cardiac cath with single-vessel CAD showing chronic total occlusion of mid RCA with brisk left-to-right collaterals.  Unsuccessful attempt despite multiple use of wires.  Could consider CTO PCI if recurrent symptoms.  Continue dual antiplatelet therapy with aspirin and Plavix.   Continue Crestor 20 mg daily.  Advised to take metoprolol tartrate 12.5 mg twice daily.  2.  Hypertension Hydrochlorothiazide Changed to Beta-Blocker post cath however since then she has gained weight and experiencing volume overload.  She will restart hydrochlorothiazide 25 mg daily.  Continue losartan 100 mg daily.  She will take metoprolol tartrate 12.5 mg twice daily instead of once daily.  If blood pressure is issue in future or plan to titrate beta-blocker, will reduce losartan dose.  3.  Hyperlipidemia Intolerance to Lipitor.  She also has myalgia on Crestor 40 mg.  She is seen by lipid clinic and currently tolerating Crestor 20 mg daily.  Medication Adjustments/Labs and Tests Ordered: Current medicines are reviewed at length with the patient today.  Concerns regarding medicines are outlined above.  No orders of the defined types were placed in this encounter.  Meds  ordered this encounter  Medications   hydrochlorothiazide (HYDRODIURIL) 25 MG tablet    Sig: Take 1 tablet (25 mg total) by mouth daily.    Dispense:  90 tablet    Refill:  1    Patient Instructions  Medication Instructions:  Restart  Hydrochlorothiazide 25mg  take 1 tablet once a day  Increase Metoprolol to 1 tablet twice a day  *If you need a refill on your cardiac medications before your next appointment, please call your pharmacy*   Lab Work: None ordered If you have labs (blood work) drawn today and your tests are completely normal, you will receive your results only by: MyChart Message (if you have MyChart) OR A paper copy in the mail If you have any lab test that is abnormal or we need to change your treatment, we will call you to review the results.   Testing/Procedures: None ordered   Follow-Up: At [facility], you and your health needs are our priority.  As part of our continuing mission to provide you with exceptional heart care, we have created designated Provider Care Teams.  These Care Teams include your primary Cardiologist (physician) and Advanced Practice Providers (APPs -  Physician Assistants and Nurse Practitioners) who all work together to provide you with the care you need, when you need it.  We recommend signing up for the patient portal called "MyChart".  Sign up information is provided on this After Visit Summary.  MyChart is used to connect with patients for Virtual Visits (Telemedicine).  Patients are able to view lab/test results, encounter notes, upcoming appointments, etc.  Non-urgent messages can be sent to your provider as well.   To learn more about what you can do with MyChart, go to [facility].    Your next appointment:   4-6 week(s)  The format for your next appointment:   In Person  Provider:   [email or web address], MD     Other Instructions   Important Information About Sugar         Lance Muss, Lorelei Pont   08/25/2021 1:41 PM    [facility]

## 2021-08-25 NOTE — Patient Instructions (Addendum)
Medication Instructions:  Restart Hydrochlorothiazide 25mg  take 1 tablet once a day  Increase Metoprolol to 1 tablet twice a day  *If you need a refill on your cardiac medications before your next appointment, please call your pharmacy*   Lab Work: None ordered If you have labs (blood work) drawn today and your tests are completely normal, you will receive your results only by: MyChart Message (if you have MyChart) OR A paper copy in the mail If you have any lab test that is abnormal or we need to change your treatment, we will call you to review the results.   Testing/Procedures: None ordered   Follow-Up: At [facility], you and your health needs are our priority.  As part of our continuing mission to provide you with exceptional heart care, we have created designated Provider Care Teams.  These Care Teams include your primary Cardiologist (physician) and Advanced Practice Providers (APPs -  Physician Assistants and Nurse Practitioners) who all work together to provide you with the care you need, when you need it.  We recommend signing up for the patient portal called "MyChart".  Sign up information is provided on this After Visit Summary.  MyChart is used to connect with patients for Virtual Visits (Telemedicine).  Patients are able to view lab/test results, encounter notes, upcoming appointments, etc.  Non-urgent messages can be sent to your provider as well.   To learn more about what you can do with MyChart, go to [facility].    Your next appointment:   4-6 week(s)  The format for your next appointment:   In Person  Provider:   [email or web address], MD     Other Instructions   Important Information About Sugar

## 2021-09-02 ENCOUNTER — Other Ambulatory Visit (HOSPITAL_COMMUNITY): Payer: Self-pay

## 2021-09-03 ENCOUNTER — Other Ambulatory Visit (HOSPITAL_COMMUNITY): Payer: Self-pay

## 2021-09-05 ENCOUNTER — Other Ambulatory Visit (HOSPITAL_COMMUNITY): Payer: Self-pay

## 2021-09-06 ENCOUNTER — Encounter (INDEPENDENT_AMBULATORY_CARE_PROVIDER_SITE_OTHER): Payer: Self-pay

## 2021-09-13 ENCOUNTER — Other Ambulatory Visit: Payer: Commercial Managed Care - PPO

## 2021-09-13 DIAGNOSIS — I2510 Atherosclerotic heart disease of native coronary artery without angina pectoris: Secondary | ICD-10-CM

## 2021-09-13 LAB — HEPATIC FUNCTION PANEL
ALT: 15 IU/L (ref 0–32)
AST: 19 IU/L (ref 0–40)
Albumin: 4.2 g/dL (ref 3.8–4.9)
Alkaline Phosphatase: 80 IU/L (ref 44–121)
Bilirubin Total: 0.3 mg/dL (ref 0.0–1.2)
Bilirubin, Direct: 0.1 mg/dL (ref 0.00–0.40)
Total Protein: 6.7 g/dL (ref 6.0–8.5)

## 2021-09-13 LAB — LIPID PANEL
Chol/HDL Ratio: 2.8 ratio (ref 0.0–4.4)
Cholesterol, Total: 161 mg/dL (ref 100–199)
HDL: 58 mg/dL
LDL Chol Calc (NIH): 86 mg/dL (ref 0–99)
Triglycerides: 93 mg/dL (ref 0–149)
VLDL Cholesterol Cal: 17 mg/dL (ref 5–40)

## 2021-09-16 ENCOUNTER — Telehealth: Payer: Self-pay | Admitting: Pharmacist

## 2021-09-16 DIAGNOSIS — I2510 Atherosclerotic heart disease of native coronary artery without angina pectoris: Secondary | ICD-10-CM

## 2021-09-16 DIAGNOSIS — E785 Hyperlipidemia, unspecified: Secondary | ICD-10-CM

## 2021-09-16 MED ORDER — EZETIMIBE 10 MG PO TABS: 10.0000 mg | ORAL_TABLET | Freq: Every day | ORAL | 3 refills | Status: DC

## 2021-09-16 NOTE — Telephone Encounter (Signed)
Called pt to follow up on tolerability of rosuvastatin and review labs. LDL-C 86. Previously 131. Goal <70. Could add ezetimibe 10mg  daily if patient willing to try.  LVM for patient to call back

## 2021-09-16 NOTE — Telephone Encounter (Signed)
Spoke with patient and reviewed labs. Recommended adding ezetimibe. She was agreeable after answering her questions on muscle pains and hair loss. Will recheck labs 10/18.

## 2021-09-29 ENCOUNTER — Ambulatory Visit: Payer: 59 | Admitting: Interventional Cardiology

## 2021-09-30 ENCOUNTER — Other Ambulatory Visit (HOSPITAL_COMMUNITY): Payer: Self-pay

## 2021-10-03 ENCOUNTER — Encounter (HOSPITAL_COMMUNITY): Payer: Self-pay

## 2021-10-06 ENCOUNTER — Other Ambulatory Visit (HOSPITAL_COMMUNITY): Payer: Self-pay

## 2021-10-06 NOTE — Progress Notes (Deleted)
Cardiology Office Note   Date:  10/06/2021   ID:  [name], Wish 04-Apr-1960, MRN [id number]  PCP:  Tally Joe, MD    No chief complaint on file.    Wt Readings from Last 3 Encounters:  08/25/21 213 lb 6.4 oz (96.8 kg)  07/28/21 198 lb (89.8 kg)  07/25/21 208 lb 6.4 oz (94.5 kg)       History of Present Illness: Heidi Holt is a 61 y.o. female  with a hx of CAD, hypertension, Psoriatic arthritis (on Humira),  hyperlipidemia and hypothyroidism seen for cath follow up.    Strong family history of CAD with father having MI at age 6. Underwent outpatient coronary CTA which showed CTO of the mid RCA with collateral flow.    Cath with single-vessel CAD with CTO of mid RCA, brisk left-to-right collaterals.  Unsuccessful attempt at PCI of the mid RCA despite using multiple wires.. Plan for DAPT with ASA/plavix for now. Maybe bring back for CTO PCI attempt in the future.  Metoprolol 12.5mg  BID, asked that she stop HCTZ for now. Lipid clinic referral as she is statin intoleran.   Seen in 7/23: " Since discontinuation of hydrochlorothiazide she has gained 8 pounds.  Has noted lower extremity edema and hand swelling.  Some orthopnea and shortness of breath.  She would like to go back on hydrochlorothiazide. "    Past Medical History:  Diagnosis Date   Allergy    Anxiety    Arthritis    Chronic kidney disease, stage 3a ([facility])    Common migraine with intractable migraine 02/20/2020   Depression    Gallstones    GERD (gastroesophageal reflux disease)    History of COVID-19 2019   HTN (hypertension)    Hypercholesterolemia    Hypertension    Kidney stone    Psoriasis    Thyroid disease    hx of hypo, pt states has resolved, no meds needed    Past Surgical History:  Procedure Laterality Date   ABDOMINAL HYSTERECTOMY     BREAST LUMPECTOMY     left   CHOLECYSTECTOMY     COLONOSCOPY     LEFT HEART CATH AND CORONARY ANGIOGRAPHY N/A 07/28/2021   Procedure: LEFT HEART CATH AND  CORONARY ANGIOGRAPHY;  Surgeon: Corky Crafts, MD;  Location: [facility];  Service: Cardiovascular;  Laterality: N/A;   UPPER GASTROINTESTINAL ENDOSCOPY       Current Outpatient Medications  Medication Sig Dispense Refill   aspirin EC 81 MG tablet Take 1 tablet (81 mg total) by mouth daily.  Swallow whole, do not chew or crush. 90 tablet 3   citalopram (CELEXA) 40 MG tablet Take 40 mg by mouth daily.     clopidogrel (PLAVIX) 75 MG tablet Take 1 tablet (75 mg total) by mouth daily. 90 tablet 3   ezetimibe (ZETIA) 10 MG tablet Take 1 tablet (10 mg total) by mouth daily. 90 tablet 3   famotidine (PEPCID AC MAXIMUM STRENGTH) 20 MG tablet Take 20 mg by mouth at bedtime.     fluticasone (FLONASE) 50 MCG/ACT nasal spray Place 1 spray into both nostrils daily as needed for allergies or rhinitis.     GLUCOSAMINE-CHONDROIT-BIOFL-MN PO Take 60 mg by mouth daily at 12 noon.     hydrochlorothiazide (HYDRODIURIL) 25 MG tablet Take 1 tablet (25 mg total) by mouth daily. 90 tablet 1   losartan (COZAAR) 100 MG tablet Take 100 mg by mouth daily.  metoprolol tartrate (LOPRESSOR) 25 MG tablet Take 1/2 tablet (12.5 mg total) by mouth 2 (two) times daily. 30 tablet 11   Multiple Vitamins-Minerals (MULTIVITAMIN WITH MINERALS) tablet Take 1 tablet by mouth daily.     nitroGLYCERIN (NITROSTAT) 0.4 MG SL tablet Place 1 tablet (0.4 mg total) under the tongue every 5 (five) minutes as needed for chest pain. 25 tablet 0   OVER THE COUNTER MEDICATION Take 6 mg by mouth daily. Balance of Nature Fruit  Vegetable     pantoprazole (PROTONIX) 40 MG tablet TAKE 1 TABLET BY MOUTH BEFORE BREAKFAST . APPOINTMENT REQUIRED FOR FUTURE REFILLS 90 tablet 0   rizatriptan (MAXALT) 10 MG tablet Take 1 tablet (10 mg total) by mouth 3 (three) times daily as needed for migraine. May repeat in 2 hours if needed 10 tablet 3   rosuvastatin (CRESTOR) 20 MG tablet Take 1 tablet (20 mg total) by mouth daily. 90 tablet 3   No  current facility-administered medications for this visit.    Allergies:   Amlodipine, Crestor [rosuvastatin], Hycodan [hydrocodone bit-homatrop mbr], Lipitor [atorvastatin], Methotrexate, Topamax [topiramate], and Latex    Social History:  The patient  reports that she has never smoked. She has never used smokeless tobacco. She reports current alcohol use. She reports that she does not use drugs.   Family History:  The patient's ***family history includes Asthma in her mother; Bell's palsy in her maternal aunt; Diabetes in her mother; Heart attack (age of onset: 50) in her father; Heart attack (age of onset: 62) in her mother; Heart disease in her father and mother; Hypertension in her father, maternal grandfather, and mother; Seizures in her father; Stomach cancer in her maternal grandmother; Stroke in her paternal grandmother.    ROS:  Please see the history of present illness.   Otherwise, review of systems are positive for ***.   All other systems are reviewed and negative.    PHYSICAL EXAM: VS:  There were no vitals taken for this visit. , BMI There is no height or weight on file to calculate BMI. GEN: Well nourished, well developed, in no acute distress HEENT: normal Neck: no JVD, carotid bruits, or masses Cardiac: ***RRR; no murmurs, rubs, or gallops,no edema  Respiratory:  clear to auscultation bilaterally, normal work of breathing GI: soft, nontender, nondistended, + BS MS: no deformity or atrophy Skin: warm and dry, no rash Neuro:  Strength and sensation are intact Psych: euthymic mood, full affect   EKG:   The ekg ordered today demonstrates ***   Recent Labs: 07/25/2021: BUN 32; Creatinine, Ser 0.90; Hemoglobin 11.4; Platelets 240; Potassium 3.9; Sodium 143 09/13/2021: ALT 15   Lipid Panel    Component Value Date/Time   CHOL 161 09/13/2021 0804   TRIG 93 09/13/2021 0804   HDL 58 09/13/2021 0804   CHOLHDL 2.8 09/13/2021 0804   LDLCALC 86 09/13/2021 0804     Other  studies Reviewed: Additional studies/ records that were reviewed today with results demonstrating: I personally reviewed the cath films.   ASSESSMENT AND PLAN:  CAD: CTO if relatively small RCA.  Left circ/OM is large.  HTN: Restarted HCTZ for volume overload. Hyperlipidemia: Had trouble with higher dose atorvastatin and Crestor.  Tolerating Crestor 20 mg daily.    Current medicines are reviewed at length with the patient today.  The patient concerns regarding her medicines were addressed.  The following changes have been made:  No change***  Labs/ tests ordered today include: *** No orders of the  defined types were placed in this encounter.   Recommend 150 minutes/week of aerobic exercise Low fat, low carb, high fiber diet recommended  Disposition:   FU in ***   Signed, Larae Grooms, MD  10/06/2021 4:22 PM    [facility] [place], [place], [place]  [address] Phone: [phone]; Fax: [phone]

## 2021-10-07 ENCOUNTER — Ambulatory Visit: Payer: 59 | Admitting: Interventional Cardiology

## 2021-10-07 DIAGNOSIS — I10 Essential (primary) hypertension: Secondary | ICD-10-CM

## 2021-10-07 DIAGNOSIS — I2510 Atherosclerotic heart disease of native coronary artery without angina pectoris: Secondary | ICD-10-CM

## 2021-10-07 DIAGNOSIS — Z8249 Family history of ischemic heart disease and other diseases of the circulatory system: Secondary | ICD-10-CM

## 2021-10-07 DIAGNOSIS — E782 Mixed hyperlipidemia: Secondary | ICD-10-CM

## 2021-10-08 ENCOUNTER — Other Ambulatory Visit (HOSPITAL_COMMUNITY): Payer: Self-pay

## 2021-10-13 ENCOUNTER — Other Ambulatory Visit (HOSPITAL_COMMUNITY): Payer: Self-pay

## 2021-10-31 ENCOUNTER — Other Ambulatory Visit (HOSPITAL_COMMUNITY): Payer: Self-pay

## 2021-10-31 ENCOUNTER — Telehealth (HOSPITAL_COMMUNITY): Payer: Self-pay

## 2021-10-31 NOTE — Telephone Encounter (Signed)
No response from pt regarding CR.  Closed referral.  

## 2021-11-24 NOTE — Progress Notes (Signed)
Cardiology Office Note   Date:  11/25/2021   ID:  Heidi Holt, Heidi Holt, Heidi Holt  PCP:  Antony Contras, MD    No chief complaint on file.  CAD  Wt Readings from Last 3 Encounters:  11/25/21 216 lb (98 kg)  08/25/21 213 lb 6.4 oz (96.8 kg)  07/28/21 198 lb (89.8 kg)       History of Present Illness: Heidi Holt is a 61 y.o. female  with CAD, HTN, hyperlipidemia.  She has had HTN and hyperlipidemia for over 10 years.   Intolerant of statins (Lipitor she believes) due to psoriatic arthritis.     On a life insurance physical, BNP was elevated several yars ago. About 375.    Strong family history of CAD with father having MI at age 54. Underwent outpatient coronary CTA which showed CTO of the mid RCA with collateral flow.    Cath in 6/23 with single-vessel CAD with CTO of mid RCA, brisk left-to-right collaterals.  Unsuccessful attempt at PCI of the mid RCA despite using multiple wires.. Plan for DAPT with ASA/plavix for now. Maybe bring back for CTO PCI attempt in the future.  Metoprolol 12.5mg  BID, asked that she stop HCTZ for now. Lipid clinic referral as she is statin intolerant.  Seen by APP in 7/23: "Here today for follow up.  Since discontinuation of hydrochlorothiazide she has gained 8 pounds.  Has noted lower extremity edema and hand swelling.  Some orthopnea and shortness of breath.  She would like to go back on hydrochlorothiazide.  No chest pain.  She continues to have dyspnea on exertion and fatigue.  She only takes metoprolol heart rate once daily."  Diagnosed with fibromyalgia.  Also diagnosed with psoriatic arthritis. Not walking much.    Denies : Chest pain. Dizziness. Nitroglycerin use. Orthopnea. Palpitations. Paroxysmal nocturnal dyspnea. Shortness of breath. Syncope.    Past Medical History:  Diagnosis Date   Allergy    Anxiety    Arthritis    Chronic kidney disease, stage 3a ([place])    Common migraine with intractable migraine 02/20/2020    Depression    Gallstones    GERD (gastroesophageal reflux disease)    History of COVID-19 2019   HTN (hypertension)    Hypercholesterolemia    Hypertension    Kidney stone    Psoriasis    Thyroid disease    hx of hypo, pt states has resolved, no meds needed    Past Surgical History:  Procedure Laterality Date   ABDOMINAL HYSTERECTOMY     BREAST LUMPECTOMY     left   CHOLECYSTECTOMY     COLONOSCOPY     LEFT HEART CATH AND CORONARY ANGIOGRAPHY N/A 07/28/2021   Procedure: LEFT HEART CATH AND CORONARY ANGIOGRAPHY;  Surgeon: Jettie Booze, MD;  Location: [facility];  Service: Cardiovascular;  Laterality: N/A;   UPPER GASTROINTESTINAL ENDOSCOPY       Current Outpatient Medications  Medication Sig Dispense Refill   Adalimumab-atto (AMJEVITA) 40 MG/0.8ML SOAJ every 14 (fourteen) days.     aspirin EC 81 MG tablet Take 1 tablet (81 mg total) by mouth daily.  Swallow whole, do not chew or crush. 90 tablet 3   citalopram (CELEXA) 40 MG tablet Take 40 mg by mouth daily.     clopidogrel (PLAVIX) 75 MG tablet Take 1 tablet (75 mg total) by mouth daily. 90 tablet 3   ezetimibe (ZETIA) 10 MG tablet Take 1 tablet (10 mg total)  by mouth daily. 90 tablet 3   famotidine (PEPCID AC MAXIMUM STRENGTH) 20 MG tablet Take 20 mg by mouth at bedtime.     fluocinonide cream (LIDEX) 0.05 % Apply topically as needed for rash.     fluticasone (FLONASE) 50 MCG/ACT nasal spray Place 1 spray into both nostrils daily as needed for allergies or rhinitis.     hydrochlorothiazide (HYDRODIURIL) 25 MG tablet Take 1 tablet (25 mg total) by mouth daily. 90 tablet 1   leflunomide (ARAVA) 20 MG tablet Take 20 mg by mouth daily.     losartan (COZAAR) 100 MG tablet Take 100 mg by mouth daily.     metoprolol tartrate (LOPRESSOR) 25 MG tablet Take 1/2 tablet (12.5 mg total) by mouth 2 (two) times daily. 30 tablet 11   Multiple Vitamins-Minerals (MULTIVITAMIN WITH MINERALS) tablet Take 1 tablet by mouth daily.      nitroGLYCERIN (NITROSTAT) 0.4 MG SL tablet Place 1 tablet (0.4 mg total) under the tongue every 5 (five) minutes as needed for chest pain. 25 tablet 0   OVER THE COUNTER MEDICATION Take 6 mg by mouth daily. Balance of Nature Fruit  Vegetable     pantoprazole (PROTONIX) 40 MG tablet TAKE 1 TABLET BY MOUTH BEFORE BREAKFAST . APPOINTMENT REQUIRED FOR FUTURE REFILLS 90 tablet 0   predniSONE (DELTASONE) 10 MG tablet Take 10 mg by mouth as needed for pain.     rizatriptan (MAXALT) 10 MG tablet Take 1 tablet (10 mg total) by mouth 3 (three) times daily as needed for migraine. May repeat in 2 hours if needed 10 tablet 3   rosuvastatin (CRESTOR) 20 MG tablet Take 1 tablet (20 mg total) by mouth daily. 90 tablet 3   No current facility-administered medications for this visit.    Allergies:   Amlodipine, Crestor [rosuvastatin], Hycodan [hydrocodone bit-homatrop mbr], Lipitor [atorvastatin], Methotrexate, Topamax [topiramate], and Latex    Social History:  The patient  reports that she has never smoked. She has never used smokeless tobacco. She reports current alcohol use. She reports that she does not use drugs.   Family History:  The patient's family history includes Asthma in her mother; Bell's palsy in her maternal aunt; Diabetes in her mother; Heart attack (age of onset: 67) in her father; Heart attack (age of onset: 37) in her mother; Heart disease in her father and mother; Hypertension in her father, maternal grandfather, and mother; Seizures in her father; Stomach cancer in her maternal grandmother; Stroke in her paternal grandmother.    ROS:  Please see the history of present illness.   Otherwise, review of systems are positive for diffuse joint pain; .   All other systems are reviewed and negative.    PHYSICAL EXAM: VS:  BP 128/60   Pulse 83   Ht 5' 6.5" (1.689 m)   Wt 216 lb (98 kg)   SpO2 98%   BMI 34.34 kg/m  , BMI Body mass index is 34.34 kg/m. GEN: Well nourished, well  developed, in no acute distress HEENT: normal Neck: no JVD, carotid bruits, or masses Cardiac: RRR; no murmurs, rubs, or gallops,no edema  Respiratory:  clear to auscultation bilaterally, normal work of breathing GI: soft, nontender, nondistended, + BS MS: no deformity or atrophy Skin: warm and dry, no rash Neuro:  Strength and sensation are intact Psych: euthymic mood, full affect    Recent Labs: 07/25/2021: BUN 32; Creatinine, Ser 0.90; Hemoglobin 11.4; Platelets 240; Potassium 3.9; Sodium 143 09/13/2021: ALT 15   Lipid  Panel    Component Value Date/Time   CHOL 161 09/13/2021 0804   TRIG 93 09/13/2021 0804   HDL 58 09/13/2021 0804   CHOLHDL 2.8 09/13/2021 0804   LDLCALC 86 09/13/2021 0804     Other studies Reviewed: Additional studies/ records that were reviewed today with results demonstrating: labs reviewed.   ASSESSMENT AND PLAN:  CAD: DAPT.  No angina on medical therapy.   Try to increase exercise.  Thus far, she has been limited by fibromyalgia and psoriatic arthritis causing diffuse pains.  She only has access to a pool in the summertime.  Asked her to consider joining a gym with a pool where water aerobics may be an option to help her get some more activity.  This may help prevent weight gain.  This has been an issue, worse since she has been taking more rheumatologic medicines. HTN: The current medical regimen is effective;  continue present plan and medications. Hyperlipidemia: Ezetemibe added to rosuvastatin.  Needs follow-up lipids when fasting.  Will reschedule. Volume overload: Had some DOE.  Doing Okay with HCTZ.  Check electrolytes since this was added recently. Palpitations: sx controlled   Current medicines are reviewed at length with the patient today.  The patient concerns regarding her medicines were addressed.  The following changes have been made:  No change  Labs/ tests ordered today include:  No orders of the defined types were placed in this  encounter.   Recommend 150 minutes/week of aerobic exercise Low fat, low carb, high fiber diet recommended  Disposition:   FU in 6 months   Signed, Larae Grooms, MD  11/25/2021 2:30 PM    [address], [place], [place]  [address] Phone: [phone]; Fax: [phone]

## 2021-11-25 ENCOUNTER — Encounter: Payer: Self-pay | Admitting: Interventional Cardiology

## 2021-11-25 ENCOUNTER — Ambulatory Visit: Payer: Commercial Managed Care - PPO | Attending: Interventional Cardiology | Admitting: Interventional Cardiology

## 2021-11-25 ENCOUNTER — Ambulatory Visit: Payer: Commercial Managed Care - PPO

## 2021-11-25 DIAGNOSIS — I2510 Atherosclerotic heart disease of native coronary artery without angina pectoris: Secondary | ICD-10-CM | POA: Diagnosis not present

## 2021-11-25 DIAGNOSIS — E782 Mixed hyperlipidemia: Secondary | ICD-10-CM | POA: Diagnosis not present

## 2021-11-25 DIAGNOSIS — I10 Essential (primary) hypertension: Secondary | ICD-10-CM

## 2021-11-25 NOTE — Patient Instructions (Signed)
Medication Instructions:  Your physician recommends that you continue on your current medications as directed. Please refer to the Current Medication list given to you today.  *If you need a refill on your cardiac medications before your next appointment, please call your pharmacy*   Lab Work: Your physician recommends that you return for lab work on November 29, 2021.  This will be fasting.  The lab opens at 7:15 AM If you have labs (blood work) drawn today and your tests are completely normal, you will receive your results only by: [place] (if you have MyChart) OR A paper copy in the mail If you have any lab test that is abnormal or we need to change your treatment, we will call you to review the results.   Testing/Procedures: none   Follow-Up: At [facility], you and your health needs are our priority.  As part of our continuing mission to provide you with exceptional heart care, we have created designated Provider Care Teams.  These Care Teams include your primary Cardiologist (physician) and Advanced Practice Providers (APPs -  Physician Assistants and Nurse Practitioners) who all work together to provide you with the care you need, when you need it.  We recommend signing up for the patient portal called "MyChart".  Sign up information is provided on this After Visit Summary.  MyChart is used to connect with patients for Virtual Visits (Telemedicine).  Patients are able to view lab/test results, encounter notes, upcoming appointments, etc.  Non-urgent messages can be sent to your provider as well.   To learn more about what you can do with MyChart, go to [email or web address].    Your next appointment:   6 month(s)  The format for your next appointment:   In Person  Provider:   Larae Grooms, MD     Other Instructions    Important Information About Sugar

## 2021-11-29 ENCOUNTER — Other Ambulatory Visit: Payer: Self-pay | Admitting: *Deleted

## 2021-11-29 ENCOUNTER — Ambulatory Visit: Payer: 59 | Attending: Interventional Cardiology

## 2021-11-29 DIAGNOSIS — E782 Mixed hyperlipidemia: Secondary | ICD-10-CM

## 2021-11-29 DIAGNOSIS — I10 Essential (primary) hypertension: Secondary | ICD-10-CM

## 2021-11-29 DIAGNOSIS — I2510 Atherosclerotic heart disease of native coronary artery without angina pectoris: Secondary | ICD-10-CM

## 2021-11-29 LAB — COMPREHENSIVE METABOLIC PANEL
ALT: 21 IU/L (ref 0–32)
AST: 26 IU/L (ref 0–40)
Albumin/Globulin Ratio: 2.3 — ABNORMAL HIGH (ref 1.2–2.2)
Albumin: 4.5 g/dL (ref 3.9–4.9)
Alkaline Phosphatase: 73 IU/L (ref 44–121)
BUN/Creatinine Ratio: 25 (ref 12–28)
BUN: 22 mg/dL (ref 8–27)
Bilirubin Total: 0.3 mg/dL (ref 0.0–1.2)
CO2: 25 mmol/L (ref 20–29)
Calcium: 9.3 mg/dL (ref 8.7–10.3)
Chloride: 102 mmol/L (ref 96–106)
Creatinine, Ser: 0.88 mg/dL (ref 0.57–1.00)
Globulin, Total: 2 g/dL (ref 1.5–4.5)
Glucose: 105 mg/dL — ABNORMAL HIGH (ref 70–99)
Potassium: 3.6 mmol/L (ref 3.5–5.2)
Sodium: 141 mmol/L (ref 134–144)
Total Protein: 6.5 g/dL (ref 6.0–8.5)
eGFR: 75 mL/min/{1.73_m2} (ref >59)

## 2021-11-30 ENCOUNTER — Other Ambulatory Visit: Payer: 59

## 2021-11-30 LAB — LIPID PANEL
Chol/HDL Ratio: 2.4 ratio (ref 0.0–4.4)
Cholesterol, Total: 112 mg/dL (ref 100–199)
HDL: 46 mg/dL (ref >39)
LDL Chol Calc (NIH): 44 mg/dL (ref 0–99)
Triglycerides: 121 mg/dL (ref 0–149)
VLDL Cholesterol Cal: 22 mg/dL (ref 5–40)

## 2021-11-30 LAB — APOLIPOPROTEIN B: Apolipoprotein B: 50 mg/dL (ref <90)

## 2021-12-20 ENCOUNTER — Other Ambulatory Visit (HOSPITAL_COMMUNITY): Payer: Self-pay

## 2022-01-26 ENCOUNTER — Encounter (HOSPITAL_COMMUNITY): Payer: Self-pay

## 2022-01-26 ENCOUNTER — Other Ambulatory Visit: Payer: Self-pay

## 2022-01-26 ENCOUNTER — Other Ambulatory Visit (HOSPITAL_COMMUNITY): Payer: Self-pay

## 2022-02-01 ENCOUNTER — Other Ambulatory Visit: Payer: Self-pay

## 2022-02-07 ENCOUNTER — Other Ambulatory Visit (HOSPITAL_COMMUNITY): Payer: Self-pay

## 2022-02-08 ENCOUNTER — Other Ambulatory Visit: Payer: Self-pay

## 2022-02-08 ENCOUNTER — Encounter (HOSPITAL_COMMUNITY): Payer: Self-pay

## 2022-02-08 ENCOUNTER — Other Ambulatory Visit (HOSPITAL_COMMUNITY): Payer: Self-pay

## 2022-02-26 NOTE — Progress Notes (Unsigned)
Office Visit    Patient Name: Heidi Holt Date of Encounter: 02/27/2022  PCP:  Antony Contras, MD   [place]  Cardiologist:  Larae Grooms, MD  Advanced Practice Provider:  No care team member to display Electrophysiologist:  None   HPI    Heidi Holt is a 62 y.o. female with a past medical history of CAD, HTN, hyperlipidemia presents today for follow-up for fatigue.  She was recently seen 11/2021 by Dr. Irish Lack.  She has had hypertension and hyperlipidemia for over 10 years.  Intolerant to statins (Lipitor she believes) due to psoriatic arthritis.  BNP was elevated several years ago.  Strong history of CAD with father having MI at age 29.  Underwent outpatient coronary CTA which showed CTO of mid RCA with collateral blood flow.  Cardiac catheterization 6/23 with single-vessel CAD and CTO of mid RCA, brisk left-to-right collaterals.  Unsuccessful attempt at PCI of the mid RCA despite using multiple wires.  Plan for DAPT with ASA/Plavix for now.  Maybe bring back for CTO PCI attempt in the future.  Metoprolol 12.5 mg twice daily and was asked to stop HCTZ at her last appointment.  Lipid clinic referral as she is statin intolerant.  Seen by APP in 7/23.  Since discontinued HCTZ she gained 8 pounds.  Had some notated lower extremity edema and hand swelling.  Some orthopnea and shortness of breath.  She was requesting to go back on the HCTZ.  No chest pain.  She continued to have some dyspnea on exertion and fatigue.  Only takes metoprolol when her heart races once daily.  Diagnosed with fibromyalgia and diagnosed with psoriatic arthritis.  Not walking much.  Today, she tells me she feels like she has no energy and gets out of breath really fast.  She also endorses heart palpitations or "fluttering".  She saw Dr. Irish Lack back in October and she states that he recommended doing a "corkscrew" procedure.  We did go through her cardiac catheterization which showed a  CTO of RCA with collateral blood flow.  We discussed adding an monitor today to see if she is having episodes of SVT.  She is also volume overloaded today and has some shortness of breath as well as lower extremity edema.  We discussed adding a as needed diuretic however, if she is having dizziness she is asked not to take this.   Reports no chest pain, pressure, or tightness.   Past Medical History    Past Medical History:  Diagnosis Date   Allergy    Anxiety    Arthritis    Chronic kidney disease, stage 3a ([place])    Common migraine with intractable migraine 02/20/2020   Depression    Gallstones    GERD (gastroesophageal reflux disease)    History of COVID-19 2019   HTN (hypertension)    Hypercholesterolemia    Hypertension    Kidney stone    Psoriasis    Thyroid disease    hx of hypo, pt states has resolved, no meds needed   Past Surgical History:  Procedure Laterality Date   ABDOMINAL HYSTERECTOMY     BREAST LUMPECTOMY     left   CHOLECYSTECTOMY     COLONOSCOPY     LEFT HEART CATH AND CORONARY ANGIOGRAPHY N/A 07/28/2021   Procedure: LEFT HEART CATH AND CORONARY ANGIOGRAPHY;  Surgeon: Jettie Booze, MD;  Location: [facility];  Service: Cardiovascular;  Laterality: N/A;   UPPER GASTROINTESTINAL  ENDOSCOPY      Allergies  Allergies  Allergen Reactions   Amlodipine Swelling   Crestor [Rosuvastatin] Other (See Comments)    myalgias   Hycodan [Hydrocodone Bit-Homatrop Mbr] Other (See Comments)    "too strong"   Lipitor [Atorvastatin] Other (See Comments)    myalgias   Methotrexate     Other reaction(s): headaches   Topamax [Topiramate] Nausea And Vomiting   Latex Swelling and Rash     EKGs/Labs/Other Studies Reviewed:   The following studies were reviewed today:   Cardiac catheterization 07/2021    Mid RCA lesion is 100% stenosed.  Brisk left to right collaterals.  This was a chronic total occlusion.  Attempt at PCI of the mid RCA was unsuccessful  despite using multiple wires.  Severe right subclavian tortuosity necessitated using an 85 cm destination sheath.  Support from the right radial approach was challenging.   Ost RCA to Prox RCA lesion is 25% stenosed.   The left ventricular systolic function is normal.   LV end diastolic pressure is normal.   The left ventricular ejection fraction is 55-65% by visual estimate.   There is no aortic valve stenosis.   Single-vessel CAD with a chronic total occlusion of the mid RCA.  Brisk left to right collaterals.  Left system is widely patent.  Normal LV function.  Normal LVEDP.  Continue medical therapy.  If she had refractory symptoms, could consider intervention from the femoral approach for better support.  EKG:  EKG is not ordered today.   Recent Labs: 07/25/2021: Hemoglobin 11.4; Platelets 240 11/29/2021: ALT 21; BUN 22; Creatinine, Ser 0.88; Potassium 3.6; Sodium 141  Recent Lipid Panel    Component Value Date/Time   CHOL 112 11/29/2021 0810   TRIG 121 11/29/2021 0810   HDL 46 11/29/2021 0810   CHOLHDL 2.4 11/29/2021 0810   LDLCALC 44 11/29/2021 0810    Home Medications   Current Meds  Medication Sig   Adalimumab-atto (AMJEVITA) 40 MG/0.8ML SOAJ every 14 (fourteen) days.   aspirin EC 81 MG tablet Take 1 tablet (81 mg total) by mouth daily.  Swallow whole, do not chew or crush.   betamethasone dipropionate (DIPROLENE) 0.05 % ointment Apply 1 Application topically as needed.   citalopram (CELEXA) 40 MG tablet Take 40 mg by mouth daily.   clopidogrel (PLAVIX) 75 MG tablet Take 1 tablet (75 mg total) by mouth daily.   ezetimibe (ZETIA) 10 MG tablet Take 1 tablet (10 mg total) by mouth daily.   famotidine (PEPCID AC MAXIMUM STRENGTH) 20 MG tablet Take 20 mg by mouth at bedtime.   fluocinonide cream (LIDEX) 0.05 % Apply topically as needed for rash.   fluticasone (FLONASE) 50 MCG/ACT nasal spray Place 1 spray into both nostrils daily as needed for allergies or rhinitis.   furosemide  (LASIX) 20 MG tablet Take one tablet by mouth as needed for shortness of breath, fluid overload or lower extremity edema.   hydrochlorothiazide (HYDRODIURIL) 25 MG tablet Take 1 tablet (25 mg total) by mouth daily.   leflunomide (ARAVA) 20 MG tablet Take 20 mg by mouth daily.   losartan (COZAAR) 100 MG tablet Take 100 mg by mouth daily.   metoprolol tartrate (LOPRESSOR) 25 MG tablet Take 1/2 tablet (12.5 mg total) by mouth 2 (two) times daily.   Multiple Vitamins-Minerals (MULTIVITAMIN WITH MINERALS) tablet Take 1 tablet by mouth daily.   nitroGLYCERIN (NITROSTAT) 0.4 MG SL tablet Place 1 tablet (0.4 mg total) under the tongue every 5 (five)  minutes as needed for chest pain.   OVER THE COUNTER MEDICATION Take 6 mg by mouth daily. Balance of Nature Fruit  Vegetable   pantoprazole (PROTONIX) 40 MG tablet TAKE 1 TABLET BY MOUTH BEFORE BREAKFAST . APPOINTMENT REQUIRED FOR FUTURE REFILLS   potassium chloride (KLOR-CON) 10 MEQ tablet Take 1 tablet (10 mEq total) by mouth daily as needed (if you take lasix).   rizatriptan (MAXALT) 10 MG tablet Take 1 tablet (10 mg total) by mouth 3 (three) times daily as needed for migraine. May repeat in 2 hours if needed   rosuvastatin (CRESTOR) 20 MG tablet Take 1 tablet (20 mg total) by mouth daily.     Review of Systems      All other systems reviewed and are otherwise negative except as noted above.  Physical Exam    VS:  BP 122/74   Pulse 76   Ht 5' 6.5" (1.689 m)   Wt 219 lb 3.2 oz (99.4 kg)   SpO2 97%   BMI 34.85 kg/m  , BMI Body mass index is 34.85 kg/m.  Wt Readings from Last 3 Encounters:  02/27/22 219 lb 3.2 oz (99.4 kg)  11/25/21 216 lb (98 kg)  08/25/21 213 lb 6.4 oz (96.8 kg)     GEN: Well nourished, well developed, in no acute distress. HEENT: normal. Neck: Supple, no JVD, carotid bruits, or masses. Cardiac: RRR, no murmurs, rubs, or gallops. No clubbing, cyanosis, edema.  Radials/PT 2+ and equal bilaterally.  Respiratory:   Respirations regular and unlabored, clear to auscultation bilaterally. GI: Soft, nontender, nondistended. MS: No deformity or atrophy. Skin: Warm and dry, no rash. Neuro:  Strength and sensation are intact. Psych: Normal affect.  Assessment & Plan    Fatigue/palpitations/SOB/lightheaded/LE edema -goes on the treadmil now she gets lightheaded -lightheaded in the room today during my exam -BP stable today -monitor ordered today -PRN lasix and potassium -BMP, mag, CBC today  CAD -stable on cardiac cath -CTO of RCA with collateral blood flow  HTN -well controlled -continue current medications  Hyperlipidemia -LDL 44 -cont crestor 20mg  daily and zetia 10mg  daily    Disposition: Follow up 2 weeks with , MD or APP.  Signed, , PA-C 02/27/2022, 5:37 PM [facility]

## 2022-02-27 ENCOUNTER — Ambulatory Visit: Payer: 59 | Attending: Physician Assistant

## 2022-02-27 ENCOUNTER — Encounter: Payer: Self-pay | Admitting: Physician Assistant

## 2022-02-27 ENCOUNTER — Ambulatory Visit: Payer: 59 | Attending: Physician Assistant | Admitting: Physician Assistant

## 2022-02-27 VITALS — BP 122/74 | HR 76 | Ht 66.5 in | Wt 219.2 lb

## 2022-02-27 DIAGNOSIS — R5383 Other fatigue: Secondary | ICD-10-CM | POA: Diagnosis not present

## 2022-02-27 DIAGNOSIS — I10 Essential (primary) hypertension: Secondary | ICD-10-CM | POA: Diagnosis not present

## 2022-02-27 DIAGNOSIS — I2510 Atherosclerotic heart disease of native coronary artery without angina pectoris: Secondary | ICD-10-CM | POA: Diagnosis not present

## 2022-02-27 DIAGNOSIS — R002 Palpitations: Secondary | ICD-10-CM

## 2022-02-27 DIAGNOSIS — E785 Hyperlipidemia, unspecified: Secondary | ICD-10-CM | POA: Diagnosis not present

## 2022-02-27 DIAGNOSIS — R0609 Other forms of dyspnea: Secondary | ICD-10-CM

## 2022-02-27 DIAGNOSIS — Z8249 Family history of ischemic heart disease and other diseases of the circulatory system: Secondary | ICD-10-CM | POA: Diagnosis not present

## 2022-02-27 MED ORDER — POTASSIUM CHLORIDE ER 10 MEQ PO TBCR
10.0000 meq | EXTENDED_RELEASE_TABLET | Freq: Every day | ORAL | 3 refills | Status: DC | PRN
Start: 2022-02-27 — End: 2022-07-11

## 2022-02-27 MED ORDER — FUROSEMIDE 20 MG PO TABS
ORAL_TABLET | ORAL | 3 refills | Status: DC
Start: 2022-02-27 — End: 2022-05-31

## 2022-02-27 NOTE — Patient Instructions (Signed)
Medication Instructions:  1.Start furosemide (Lasix) 20 mg as needed for shortness of breath, lower extremity edema or fluid overload. Do not take if you are having dizziness. 2.Start potassium 10 meq take only if you take the furosemide (Lasix) *If you need a refill on your cardiac medications before your next appointment, please call your pharmacy*   Lab Work: CBC, BMP and Mag today If you have labs (blood work) drawn today and your tests are completely normal, you will receive your results only by: [place] (if you have MyChart) OR A paper copy in the mail If you have any lab test that is abnormal or we need to change your treatment, we will call you to review the results.   Testing/Procedures: [place]- Long Term Monitor Instructions  Your physician has requested you wear a ZIO patch monitor for 7 days.  This is a single patch monitor. [employer] supplies one patch monitor per enrollment. Additional stickers are not available. Please do not apply patch if you will be having a Nuclear Stress Test,  Echocardiogram, Cardiac CT, MRI, or Chest Xray during the period you would be wearing the  monitor. The patch cannot be worn during these tests. You cannot remove and re-apply the  ZIO XT patch monitor.  Your ZIO patch monitor will be mailed 3 day USPS to your address on file. It may take 3-5 days  to receive your monitor after you have been enrolled.  Once you have received your monitor, please review the enclosed instructions. Your monitor  has already been registered assigning a specific monitor serial # to you.  Billing and Patient Assistance Program Information  We have supplied [employer] with any of your insurance information on file for billing purposes. [employer] offers a sliding scale Patient Assistance Program for patients that do not have  insurance, or whose insurance does not completely cover the cost of the ZIO monitor.  You must apply for the Patient Assistance Program to  qualify for this discounted rate.  To apply, please call [employer] at [phone], select option 4, select option 2, ask to apply for  Patient Assistance Program. Theodore Demark will ask your household income, and how many people  are in your household. They will quote your out-of-pocket cost based on that information.  [employer] will also be able to set up a 42-month, interest-free payment plan if needed.  Applying the monitor   Shave hair from upper left chest.  Hold abrader disc by orange tab. Rub abrader in 40 strokes over the upper left chest as  indicated in your monitor instructions.  Clean area with 4 enclosed alcohol pads. Let dry.  Apply patch as indicated in monitor instructions. Patch will be placed under collarbone on left  side of chest with arrow pointing upward.  Rub patch adhesive wings for 2 minutes. Remove white label marked "1". Remove the white  label marked "2". Rub patch adhesive wings for 2 additional minutes.  While looking in a mirror, press and release button in center of patch. A small green light will  flash 3-4 times. This will be your only indicator that the monitor has been turned on.  Do not shower for the first 24 hours. You may shower after the first 24 hours.  Press the button if you feel a symptom. You will hear a small click. Record Date, Time and  Symptom in the Patient Logbook.  When you are ready to remove the patch, follow instructions on the last 2 pages of Patient  Logbook. Stick patch monitor onto the last page of Patient Logbook.  Place Patient Logbook in the blue and white box. Use locking tab on box and tape box closed  securely. The blue and white box has prepaid postage on it. Please place it in the mailbox as  soon as possible. Your physician should have your test results approximately 7 days after the  monitor has been mailed back to [facility].  Call [place] at [phone] if you have questions regarding  your ZIO XT  patch monitor. Call them immediately if you see an orange light blinking on your  monitor.  If your monitor falls off in less than 4 days, contact our Monitor department at [phone].  If your monitor becomes loose or falls off after 4 days call [employer] at [phone] for  suggestions on securing your monitor    Follow-Up: At [facility], you and your health needs are our priority.  As part of our continuing mission to provide you with exceptional heart care, we have created designated Provider Care Teams.  These Care Teams include your primary Cardiologist (physician) and Advanced Practice Providers (APPs -  Physician Assistants and Nurse Practitioners) who all work together to provide you with the care you need, when you need it.   Your next appointment:   2-3 week(s)  Provider:   Larae Grooms, MD  or APP

## 2022-02-27 NOTE — Progress Notes (Unsigned)
Enrolled for [employer] to mail a ZIO XT long term holter monitor to the patients address on file.   Dr. Varanasi to read. 

## 2022-02-28 LAB — CBC
Hematocrit: 34.9 % (ref 34.0–46.6)
Hemoglobin: 11.7 g/dL (ref 11.1–15.9)
MCH: 31.2 pg (ref 26.6–33.0)
MCHC: 33.5 g/dL (ref 31.5–35.7)
MCV: 93 fL (ref 79–97)
Platelets: 196 10*3/uL (ref 150–450)
RBC: 3.75 x10E6/uL — ABNORMAL LOW (ref 3.77–5.28)
RDW: 12.3 % (ref 11.7–15.4)
WBC: 8.1 10*3/uL (ref 3.4–10.8)

## 2022-02-28 LAB — BASIC METABOLIC PANEL WITH GFR
BUN/Creatinine Ratio: 21 (ref 12–28)
BUN: 19 mg/dL (ref 8–27)
CO2: 22 mmol/L (ref 20–29)
Calcium: 9.7 mg/dL (ref 8.7–10.3)
Chloride: 102 mmol/L (ref 96–106)
Creatinine, Ser: 0.9 mg/dL (ref 0.57–1.00)
Glucose: 93 mg/dL (ref 70–99)
Potassium: 3.6 mmol/L (ref 3.5–5.2)
Sodium: 139 mmol/L (ref 134–144)
eGFR: 73 mL/min/1.73

## 2022-02-28 LAB — MAGNESIUM: Magnesium: 2.1 mg/dL (ref 1.6–2.3)

## 2022-03-02 DIAGNOSIS — R002 Palpitations: Secondary | ICD-10-CM | POA: Diagnosis not present

## 2022-03-11 ENCOUNTER — Other Ambulatory Visit (HOSPITAL_COMMUNITY): Payer: Self-pay

## 2022-03-15 DIAGNOSIS — R002 Palpitations: Secondary | ICD-10-CM | POA: Diagnosis not present

## 2022-03-20 ENCOUNTER — Telehealth: Payer: Self-pay

## 2022-03-20 NOTE — Telephone Encounter (Signed)
The patient has been notified of the result and verbalized understanding.  All questions (if any) were answered. Gershon Crane, LPN 0/06/3974 73:41 AM

## 2022-03-20 NOTE — Telephone Encounter (Signed)
-----   Message from Elgie Collard, [place] sent at 03/20/2022  8:55 AM EST ----- Ms. Lavery,   Your monitor showed mostly normal sinus rhythm with rare extra beats on the top or bottom chamber of your heart.  You do not have any dangerous arrhythmias.  Overall, unremarkable monitor.  If you have questions please let me know.  Elgie Collard, PA-C

## 2022-03-24 ENCOUNTER — Ambulatory Visit: Payer: 59 | Attending: Physician Assistant | Admitting: Physician Assistant

## 2022-03-24 ENCOUNTER — Encounter: Payer: Self-pay | Admitting: Physician Assistant

## 2022-03-24 VITALS — BP 146/82 | HR 70 | Ht 66.5 in | Wt 216.2 lb

## 2022-03-24 DIAGNOSIS — R5383 Other fatigue: Secondary | ICD-10-CM | POA: Diagnosis not present

## 2022-03-24 DIAGNOSIS — I10 Essential (primary) hypertension: Secondary | ICD-10-CM

## 2022-03-24 DIAGNOSIS — R002 Palpitations: Secondary | ICD-10-CM

## 2022-03-24 DIAGNOSIS — E785 Hyperlipidemia, unspecified: Secondary | ICD-10-CM

## 2022-03-24 DIAGNOSIS — I2510 Atherosclerotic heart disease of native coronary artery without angina pectoris: Secondary | ICD-10-CM | POA: Diagnosis not present

## 2022-03-24 MED ORDER — METOPROLOL TARTRATE 25 MG PO TABS: 25.0000 mg | ORAL_TABLET | Freq: Two times a day (BID) | ORAL | 3 refills | Status: DC

## 2022-03-24 MED ORDER — ISOSORBIDE MONONITRATE ER 30 MG PO TB24: 30.0000 mg | ORAL_TABLET | Freq: Every day | ORAL | 3 refills | Status: AC

## 2022-03-24 NOTE — Progress Notes (Signed)
Office Visit    Patient Name: Heidi Holt Date of Encounter: 03/24/2022  PCP:  Antony Contras, MD   [place]  Cardiologist:  Larae Grooms, MD  Advanced Practice Provider:  No care team member to display Electrophysiologist:  None   HPI    Heidi Holt is a 62 y.o. female with a past medical history of CAD, HTN, hyperlipidemia presents today for follow-up for fatigue.  She was recently seen 11/2021 by Dr. Irish Lack.  She has had hypertension and hyperlipidemia for over 10 years.  Intolerant to statins (Lipitor she believes) due to psoriatic arthritis.  BNP was elevated several years ago.  Strong history of CAD with father having MI at age 60.  Underwent outpatient coronary CTA which showed CTO of mid RCA with collateral blood flow.  Cardiac catheterization 6/23 with single-vessel CAD and CTO of mid RCA, brisk left-to-right collaterals.  Unsuccessful attempt at PCI of the mid RCA despite using multiple wires.  Plan for DAPT with ASA/Plavix for now.  Maybe bring back for CTO PCI attempt in the future.  Metoprolol 12.5 mg twice daily and was asked to stop HCTZ at her last appointment.  Lipid clinic referral as she is statin intolerant.  Seen by APP in 7/23.  Since discontinued HCTZ she gained 8 pounds.  Had some notated lower extremity edema and hand swelling.  Some orthopnea and shortness of breath.  She was requesting to go back on the HCTZ.  No chest pain.  She continued to have some dyspnea on exertion and fatigue.  Only takes metoprolol when her heart races once daily.  Diagnosed with fibromyalgia and diagnosed with psoriatic arthritis.  Not walking much.  She was last seen by me 02/27/22, and she endorsed heart palpitations or "fluttering".  She saw Dr. Irish Lack back in October and she states that he recommended doing a "corkscrew" procedure.  We did go through her cardiac catheterization which showed a CTO of RCA with collateral blood flow.  We discussed  adding an monitor today to see if she is having episodes of SVT.  She is also volume overloaded today and has some shortness of breath as well as lower extremity edema.  We discussed adding a as needed diuretic however, if she is having dizziness she is asked not to take this.  Today, she states that she is having some squeezing on the left side of her chest and down the left side. This occurs when she is laying down. She is also having some pain in the center of her chest which randomly comes and goes. She is also having SOB and lightheadedness. She does have presyncope as well. No vision narrowing and no difference with her palpitations with the addition of metoprolol. Palpitations occur independently of the pain. Monitor reviewed with the patient which did not show any dangerous arrhythmias.   We discussed her cardiac cath from June with the CTO of her mid RCA and how a stent was unable to be placed given her anatomy. She does have collaterals which feed this area of the heart. She would like this fixed since she is still having symptoms. I discussed her case with Dr. Irish Lack and he spoke with the patient today.   No edema, orthopnea, PND.   Past Medical History    Past Medical History:  Diagnosis Date   Allergy    Anxiety    Arthritis    Chronic kidney disease, stage 3a ([place])    Common  migraine with intractable migraine 02/20/2020   Depression    Gallstones    GERD (gastroesophageal reflux disease)    History of COVID-19 2019   HTN (hypertension)    Hypercholesterolemia    Hypertension    Kidney stone    Psoriasis    Thyroid disease    hx of hypo, pt states has resolved, no meds needed   Past Surgical History:  Procedure Laterality Date   ABDOMINAL HYSTERECTOMY     BREAST LUMPECTOMY     left   CHOLECYSTECTOMY     COLONOSCOPY     LEFT HEART CATH AND CORONARY ANGIOGRAPHY N/A 07/28/2021   Procedure: LEFT HEART CATH AND CORONARY ANGIOGRAPHY;  Surgeon: Jettie Booze, MD;   Location: [facility];  Service: Cardiovascular;  Laterality: N/A;   UPPER GASTROINTESTINAL ENDOSCOPY      Allergies  Allergies  Allergen Reactions   Amlodipine Swelling   Crestor [Rosuvastatin] Other (See Comments)    myalgias   Hycodan [Hydrocodone Bit-Homatrop Mbr] Other (See Comments)    "too strong"   Lipitor [Atorvastatin] Other (See Comments)    myalgias   Methotrexate     Other reaction(s): headaches   Topamax [Topiramate] Nausea And Vomiting   Latex Swelling and Rash     EKGs/Labs/Other Studies Reviewed:   The following studies were reviewed today:   Cardiac catheterization 07/2021    Mid RCA lesion is 100% stenosed.  Brisk left to right collaterals.  This was a chronic total occlusion.  Attempt at PCI of the mid RCA was unsuccessful despite using multiple wires.  Severe right subclavian tortuosity necessitated using an 85 cm destination sheath.  Support from the right radial approach was challenging.   Ost RCA to Prox RCA lesion is 25% stenosed.   The left ventricular systolic function is normal.   LV end diastolic pressure is normal.   The left ventricular ejection fraction is 55-65% by visual estimate.   There is no aortic valve stenosis.   Single-vessel CAD with a chronic total occlusion of the mid RCA.  Brisk left to right collaterals.  Left system is widely patent.  Normal LV function.  Normal LVEDP.  Continue medical therapy.  If she had refractory symptoms, could consider intervention from the femoral approach for better support.  EKG:  EKG is not ordered today.   Recent Labs: 11/29/2021: ALT 21 02/27/2022: BUN 19; Creatinine, Ser 0.90; Hemoglobin 11.7; Magnesium 2.1; Platelets 196; Potassium 3.6; Sodium 139  Recent Lipid Panel    Component Value Date/Time   CHOL 112 11/29/2021 0810   TRIG 121 11/29/2021 0810   HDL 46 11/29/2021 0810   CHOLHDL 2.4 11/29/2021 0810   LDLCALC 44 11/29/2021 0810    Home Medications   Current Meds  Medication  Sig   Adalimumab-atto (AMJEVITA) 40 MG/0.8ML SOAJ every 14 (fourteen) days.   aspirin EC 81 MG tablet Take 1 tablet (81 mg total) by mouth daily.  Swallow whole, do not chew or crush.   betamethasone dipropionate (DIPROLENE) 0.05 % ointment Apply 1 Application topically as needed.   citalopram (CELEXA) 40 MG tablet Take 40 mg by mouth daily.   clopidogrel (PLAVIX) 75 MG tablet Take 1 tablet (75 mg total) by mouth daily.   ezetimibe (ZETIA) 10 MG tablet Take 1 tablet (10 mg total) by mouth daily.   famotidine (PEPCID AC MAXIMUM STRENGTH) 20 MG tablet Take 20 mg by mouth at bedtime.   fluocinonide cream (LIDEX) 0.05 % Apply topically as needed for  rash.   fluticasone (FLONASE) 50 MCG/ACT nasal spray Place 1 spray into both nostrils daily as needed for allergies or rhinitis.   furosemide (LASIX) 20 MG tablet Take one tablet by mouth as needed for shortness of breath, fluid overload or lower extremity edema.   hydrochlorothiazide (HYDRODIURIL) 25 MG tablet Take 1 tablet (25 mg total) by mouth daily.   isosorbide mononitrate (IMDUR) 30 MG 24 hr tablet Take 1 tablet (30 mg total) by mouth daily.   leflunomide (ARAVA) 20 MG tablet Take 20 mg by mouth daily.   losartan (COZAAR) 100 MG tablet Take 100 mg by mouth daily.   metoprolol tartrate (LOPRESSOR) 25 MG tablet Take 1 tablet (25 mg total) by mouth 2 (two) times daily.   Multiple Vitamins-Minerals (MULTIVITAMIN WITH MINERALS) tablet Take 1 tablet by mouth daily.   nitroGLYCERIN (NITROSTAT) 0.4 MG SL tablet Place 1 tablet (0.4 mg total) under the tongue every 5 (five) minutes as needed for chest pain.   OVER THE COUNTER MEDICATION Take 6 mg by mouth daily. Balance of Nature Fruit  Vegetable   pantoprazole (PROTONIX) 40 MG tablet TAKE 1 TABLET BY MOUTH BEFORE BREAKFAST . APPOINTMENT REQUIRED FOR FUTURE REFILLS   potassium chloride (KLOR-CON) 10 MEQ tablet Take 1 tablet (10 mEq total) by mouth daily as needed (if you take lasix).   rizatriptan  (MAXALT) 10 MG tablet Take 1 tablet (10 mg total) by mouth 3 (three) times daily as needed for migraine. May repeat in 2 hours if needed   rosuvastatin (CRESTOR) 20 MG tablet Take 1 tablet (20 mg total) by mouth daily.   [DISCONTINUED] metoprolol tartrate (LOPRESSOR) 25 MG tablet Take 1/2 tablet (12.5 mg total) by mouth 2 (two) times daily.     Review of Systems      All other systems reviewed and are otherwise negative except as noted above.  Physical Exam    VS:  BP (!) 146/82   Pulse 70   Ht 5' 6.5" (1.689 m)   Wt 216 lb 3.2 oz (98.1 kg)   SpO2 98%   BMI 34.37 kg/m  , BMI Body mass index is 34.37 kg/m.  Wt Readings from Last 3 Encounters:  03/24/22 216 lb 3.2 oz (98.1 kg)  02/27/22 219 lb 3.2 oz (99.4 kg)  11/25/21 216 lb (98 kg)     GEN: Well nourished, well developed, in no acute distress. HEENT: normal. Neck: Supple, no JVD, carotid bruits, or masses. Cardiac: RRR, no murmurs, rubs, or gallops. No clubbing, cyanosis, edema.  Radials/PT 2+ and equal bilaterally.  Respiratory:  Respirations regular and unlabored, clear to auscultation bilaterally. GI: Soft, nontender, nondistended. MS: No deformity or atrophy. Skin: Warm and dry, no rash. Neuro:  Strength and sensation are intact. Psych: Normal affect.  Assessment & Plan    Fatigue/palpitations/SOB/lightheaded/chest pain -goes on the treadmil now she gets lightheaded/SOB -BP stable today -monitor reviewed and negative for dangerous arrhythmias  -PRN lasix and potassium -increase BB and add Imdur -encouraged her to use her nitro for chest pain  CAD -stable on cardiac cath -CTO of RCA with collateral blood flow  HTN -well controlled -continue current medications  Hyperlipidemia -LDL 44 -cont crestor 72m daily and zetia 165mdaily    Disposition: Follow up 3-4 months with JaLarae GroomsMD or APP.  Signed, TeElgie CollardPA-C 03/24/2022, 4:52 PM [facility]

## 2022-03-24 NOTE — Patient Instructions (Signed)
Medication Instructions:  1.Increase metoprolol tartrate to 25 mg twice a day 2.Start Imdur 30 mg daily *If you need a refill on your cardiac medications before your next appointment, please call your pharmacy*   Lab Work: None If you have labs (blood work) drawn today and your tests are completely normal, you will receive your results only by: [facility] (if you have MyChart) OR A paper copy in the mail If you have any lab test that is abnormal or we need to change your treatment, we will call you to review the results.   Follow-Up: At [facility], you and your health needs are our priority.  As part of our continuing mission to provide you with exceptional heart care, we have created designated Provider Care Teams.  These Care Teams include your primary Cardiologist (physician) and Advanced Practice Providers (APPs -  Physician Assistants and Nurse Practitioners) who all work together to provide you with the care you need, when you need it.   Your next appointment:   05/29/22 at 2:00 PM  Provider:   Larae Grooms, MD    Other Instructions Keep a log of your blood pressure and heart rate at home and send [place] some readings through your mychart.

## 2022-04-05 ENCOUNTER — Other Ambulatory Visit (HOSPITAL_COMMUNITY): Payer: Self-pay

## 2022-04-14 ENCOUNTER — Other Ambulatory Visit (HOSPITAL_BASED_OUTPATIENT_CLINIC_OR_DEPARTMENT_OTHER): Payer: Self-pay

## 2022-05-29 ENCOUNTER — Ambulatory Visit: Payer: 59 | Admitting: Interventional Cardiology

## 2022-05-31 ENCOUNTER — Other Ambulatory Visit (HOSPITAL_COMMUNITY): Payer: Self-pay

## 2022-05-31 ENCOUNTER — Other Ambulatory Visit: Payer: Self-pay

## 2022-05-31 MED ORDER — HYDROCHLOROTHIAZIDE 25 MG PO TABS: 25.0000 mg | ORAL_TABLET | Freq: Every day | ORAL | 3 refills | Status: DC

## 2022-05-31 MED ORDER — CLOPIDOGREL BISULFATE 75 MG PO TABS: 75.0000 mg | ORAL_TABLET | Freq: Every day | ORAL | 3 refills | Status: DC

## 2022-05-31 MED ORDER — EZETIMIBE 10 MG PO TABS: 10.0000 mg | ORAL_TABLET | Freq: Every day | ORAL | 3 refills | Status: DC

## 2022-05-31 MED ORDER — NITROGLYCERIN 0.4 MG SL SUBL: 0.4000 mg | SUBLINGUAL_TABLET | SUBLINGUAL | 10 refills | Status: DC | PRN

## 2022-05-31 MED ORDER — ROSUVASTATIN CALCIUM 20 MG PO TABS: 20.0000 mg | ORAL_TABLET | Freq: Every day | ORAL | 3 refills | Status: DC

## 2022-05-31 MED ORDER — FUROSEMIDE 20 MG PO TABS: ORAL_TABLET | ORAL | 3 refills | Status: DC

## 2022-05-31 NOTE — Telephone Encounter (Signed)
Pt's medications were sent to pt's pharmacy as requested. Confirmation received.  

## 2022-06-01 ENCOUNTER — Other Ambulatory Visit: Payer: Self-pay

## 2022-06-07 ENCOUNTER — Other Ambulatory Visit: Payer: Self-pay | Admitting: Interventional Cardiology

## 2022-06-07 ENCOUNTER — Other Ambulatory Visit (HOSPITAL_COMMUNITY): Payer: Self-pay

## 2022-06-08 ENCOUNTER — Other Ambulatory Visit: Payer: Self-pay

## 2022-07-01 ENCOUNTER — Other Ambulatory Visit (HOSPITAL_COMMUNITY): Payer: Self-pay

## 2022-07-04 DIAGNOSIS — L409 Psoriasis, unspecified: Secondary | ICD-10-CM | POA: Diagnosis not present

## 2022-07-04 DIAGNOSIS — E782 Mixed hyperlipidemia: Secondary | ICD-10-CM | POA: Diagnosis not present

## 2022-07-04 DIAGNOSIS — I2510 Atherosclerotic heart disease of native coronary artery without angina pectoris: Secondary | ICD-10-CM | POA: Diagnosis not present

## 2022-07-04 DIAGNOSIS — K219 Gastro-esophageal reflux disease without esophagitis: Secondary | ICD-10-CM | POA: Diagnosis not present

## 2022-07-04 DIAGNOSIS — J309 Allergic rhinitis, unspecified: Secondary | ICD-10-CM | POA: Diagnosis not present

## 2022-07-04 DIAGNOSIS — E039 Hypothyroidism, unspecified: Secondary | ICD-10-CM | POA: Diagnosis not present

## 2022-07-04 DIAGNOSIS — N182 Chronic kidney disease, stage 2 (mild): Secondary | ICD-10-CM | POA: Diagnosis not present

## 2022-07-04 DIAGNOSIS — I10 Essential (primary) hypertension: Secondary | ICD-10-CM | POA: Diagnosis not present

## 2022-07-04 DIAGNOSIS — F329 Major depressive disorder, single episode, unspecified: Secondary | ICD-10-CM | POA: Diagnosis not present

## 2022-07-04 DIAGNOSIS — Z23 Encounter for immunization: Secondary | ICD-10-CM | POA: Diagnosis not present

## 2022-07-11 ENCOUNTER — Encounter: Payer: Self-pay | Admitting: Physician Assistant

## 2022-07-11 ENCOUNTER — Ambulatory Visit: Payer: 59 | Attending: Interventional Cardiology | Admitting: Physician Assistant

## 2022-07-11 ENCOUNTER — Ambulatory Visit: Payer: 59 | Admitting: Physician Assistant

## 2022-07-11 VITALS — BP 116/74 | HR 70 | Ht 66.5 in | Wt 193.0 lb

## 2022-07-11 DIAGNOSIS — R002 Palpitations: Secondary | ICD-10-CM | POA: Diagnosis not present

## 2022-07-11 DIAGNOSIS — R5383 Other fatigue: Secondary | ICD-10-CM | POA: Diagnosis not present

## 2022-07-11 DIAGNOSIS — E782 Mixed hyperlipidemia: Secondary | ICD-10-CM

## 2022-07-11 DIAGNOSIS — R0609 Other forms of dyspnea: Secondary | ICD-10-CM

## 2022-07-11 DIAGNOSIS — Z8249 Family history of ischemic heart disease and other diseases of the circulatory system: Secondary | ICD-10-CM

## 2022-07-11 DIAGNOSIS — I2510 Atherosclerotic heart disease of native coronary artery without angina pectoris: Secondary | ICD-10-CM | POA: Diagnosis not present

## 2022-07-11 DIAGNOSIS — E785 Hyperlipidemia, unspecified: Secondary | ICD-10-CM

## 2022-07-11 MED ORDER — LOSARTAN POTASSIUM 50 MG PO TABS
50.0000 mg | ORAL_TABLET | Freq: Every day | ORAL | 3 refills | Status: DC
Start: 2022-07-11 — End: 2023-01-23

## 2022-07-11 MED ORDER — POTASSIUM CHLORIDE ER 10 MEQ PO TBCR: 10.0000 meq | EXTENDED_RELEASE_TABLET | Freq: Every day | ORAL | 3 refills | Status: AC

## 2022-07-11 NOTE — Patient Instructions (Addendum)
Medication Instructions:    START TAKING:  LOSARTAN 50 MG ONCE A DAY    START TAKING :   POTASSIUM 10 MEQ ONCE A DAY   *If you need a refill on your cardiac medications before your next appointment, please call your pharmacy*   Lab Work: RETURN BACK IN 2 WEEKS FOR BMET   If you have labs (blood work) drawn today and your tests are completely normal, you will receive your results only by: MyChart Message (if you have MyChart) OR A paper copy in the mail If you have any lab test that is abnormal or we need to change your treatment, we will call you to review the results.   Testing/Procedures: NONE ORDERED  TODAY    Follow-Up: At [facility], you and your health needs are our priority.  As part of our continuing mission to provide you with exceptional heart care, we have created designated Provider Care Teams.  These Care Teams include your primary Cardiologist (physician) and Advanced Practice Providers (APPs -  Physician Assistants and Nurse Practitioners) who all work together to provide you with the care you need, when you need it.  We recommend signing up for the patient portal called "MyChart".  Sign up information is provided on this After Visit Summary.  MyChart is used to connect with patients for Virtual Visits (Telemedicine).  Patients are able to view lab/test results, encounter notes, upcoming appointments, etc.  Non-urgent messages can be sent to your provider as well.   To learn more about what you can do with MyChart, go to [email or web address].    Your next appointment:   3 month(s)  Provider:   Lance Muss, MD     Other Instructions   Low-Sodium Eating Plan Salt (sodium) helps you keep a healthy balance of fluids in your body. Too much sodium can raise your blood pressure. It can also cause fluid and waste to be held in your body. Your health care provider or dietitian may recommend a low-sodium eating plan if you have high blood pressure  (hypertension), kidney disease, liver disease, or heart failure. Eating less sodium can help lower your blood pressure and reduce swelling. It can also protect your heart, liver, and kidneys. What are tips for following this plan? Reading food labels  Check food labels for the amount of sodium per serving. If you eat more than one serving, you must multiply the listed amount by the number of servings. Choose foods with less than 140 milligrams (mg) of sodium per serving. Avoid foods with 300 mg of sodium or more per serving. Always check how much sodium is in a product, even if the label says "unsalted" or "no salt added." Shopping  Buy products labeled as "low-sodium" or "no salt added." Buy fresh foods. Avoid canned foods and pre-made or frozen meals. Avoid canned, cured, or processed meats. Buy breads that have less than 80 mg of sodium per slice. Cooking  Eat more home-cooked food. Try to eat less restaurant, buffet, and fast food. Try not to add salt when you cook. Use salt-free seasonings or herbs instead of table salt or sea salt. Check with your provider or pharmacist before using salt substitutes. Cook with plant-based oils, such as canola, sunflower, or olive oil. Meal planning When eating at a restaurant, ask if your food can be made with less salt or no salt. Avoid dishes labeled as brined, pickled, cured, or smoked. Avoid dishes made with soy sauce, miso, or teriyaki sauce. Avoid foods  that have monosodium glutamate (MSG) in them. MSG may be added to some restaurant food, sauces, soups, bouillon, and canned foods. Make meals that can be grilled, baked, poached, roasted, or steamed. These are often made with less sodium. General information Try to limit your sodium intake to 1,500-2,300 mg each day, or the amount told by your provider. What foods should I eat? Fruits Fresh, frozen, or canned fruit. Fruit juice. Vegetables Fresh or frozen vegetables. "No salt added" canned  vegetables. "No salt added" tomato sauce and paste. Low-sodium or reduced-sodium tomato and vegetable juice. Grains Low-sodium cereals, such as oats, puffed wheat and rice, and shredded wheat. Low-sodium crackers. Unsalted rice. Unsalted pasta. Low-sodium bread. Whole grain breads and whole grain pasta. Meats and other proteins Fresh or frozen meat, poultry, seafood, and fish. These should have no added salt. Low-sodium canned tuna and salmon. Unsalted nuts. Dried peas, beans, and lentils without added salt. Unsalted canned beans. Eggs. Unsalted nut butters. Dairy Milk. Soy milk. Cheese that is naturally low in sodium, such as ricotta cheese, fresh mozzarella, or Swiss cheese. Low-sodium or reduced-sodium cheese. Cream cheese. Yogurt. Seasonings and condiments Fresh and dried herbs and spices. Salt-free seasonings. Low-sodium mustard and ketchup. Sodium-free salad dressing. Sodium-free light mayonnaise. Fresh or refrigerated horseradish. Lemon juice. Vinegar. Other foods Homemade, reduced-sodium, or low-sodium soups. Unsalted popcorn and pretzels. Low-salt or salt-free chips. The items listed above may not be all the foods and drinks you can have. Talk to a dietitian to learn more. What foods should I avoid? Vegetables Sauerkraut, pickled vegetables, and relishes. Olives. [place] fries. Onion rings. Regular canned vegetables, except low-sodium or reduced-sodium items. Regular canned tomato sauce and paste. Regular tomato and vegetable juice. Frozen vegetables in sauces. Grains Instant hot cereals. Bread stuffing, pancake, and biscuit mixes. Croutons. Seasoned rice or pasta mixes. Noodle soup cups. Boxed or frozen macaroni and cheese. Regular salted crackers. Self-rising flour. Meats and other proteins Meat or fish that is salted, canned, smoked, spiced, or pickled. Precooked or cured meat, such as sausages or meat loaves. Tomasa Blase. Ham. Pepperoni. Hot dogs. Corned beef. Chipped beef. Salt pork. Jerky.  Pickled herring, anchovies, and sardines. Regular canned tuna. Salted nuts. Dairy Processed cheese and cheese spreads. Hard cheeses. Cheese curds. Blue cheese. Feta cheese. String cheese. Regular cottage cheese. Buttermilk. Canned milk. Fats and oils Salted butter. Regular margarine. Ghee. Bacon fat. Seasonings and condiments Onion salt, garlic salt, seasoned salt, table salt, and sea salt. Canned and packaged gravies. Worcestershire sauce. Tartar sauce. Barbecue sauce. Teriyaki sauce. Soy sauce, including reduced-sodium soy sauce. Steak sauce. Fish sauce. Oyster sauce. Cocktail sauce. Horseradish that you find on the shelf. Regular ketchup and mustard. Meat flavorings and tenderizers. Bouillon cubes. Hot sauce. Pre-made or packaged marinades. Pre-made or packaged taco seasonings. Relishes. Regular salad dressings. Salsa. Other foods Salted popcorn and pretzels. Corn chips and puffs. Potato and tortilla chips. Canned or dried soups. Pizza. Frozen entrees and pot pies. The items listed above may not be all the foods and drinks you should avoid. Talk to a dietitian to learn more. This information is not intended to replace advice given to you by your health care provider. Make sure you discuss any questions you have with your health care provider. Document Revised: 02/16/2022 Document Reviewed: 02/16/2022 Elsevier Patient Education  2024 [employer] Heart-Healthy Eating Plan Eating a healthy diet is important for the health of your heart. A heart-healthy eating plan includes: Eating less unhealthy fats. Eating more healthy fats. Eating less salt in your  food. Salt is also called sodium. Making other changes in your diet. Talk with your doctor or a diet specialist (dietitian) to create an eating plan that is right for you. What is my plan? Your doctor may recommend an eating plan that includes: Total fat: ______% or less of total calories a day. Saturated fat: ______% or less of total calories  a day. Cholesterol: less than _________mg a day. Sodium: less than _________mg a day. What are tips for following this plan? Cooking Avoid frying your food. Try to bake, boil, grill, or broil it instead. You can also reduce fat by: Removing the skin from poultry. Removing all visible fats from meats. Steaming vegetables in water or broth. Meal planning  At meals, divide your plate into four equal parts: Fill one-half of your plate with vegetables and green salads. Fill one-fourth of your plate with whole grains. Fill one-fourth of your plate with lean protein foods. Eat 2-4 cups of vegetables per day. One cup of vegetables is: 1 cup (91 g) broccoli or cauliflower florets. 2 medium carrots. 1 large bell pepper. 1 large sweet potato. 1 large tomato. 1 medium white potato. 2 cups (150 g) raw leafy greens. Eat 1-2 cups of fruit per day. One cup of fruit is: 1 small apple 1 large banana 1 cup (237 g) mixed fruit, 1 large orange,  cup (82 g) dried fruit, 1 cup (240 mL) 100% fruit juice. Eat more foods that have soluble fiber. These are apples, broccoli, carrots, beans, peas, and barley. Try to get 20-30 g of fiber per day. Eat 4-5 servings of nuts, legumes, and seeds per week: 1 serving of dried beans or legumes equals  cup (90 g) cooked. 1 serving of nuts is  oz (12 almonds, 24 pistachios, or 7 walnut halves). 1 serving of seeds equals  oz (8 g). General information Eat more home-cooked food. Eat less restaurant, buffet, and fast food. Limit or avoid alcohol. Limit foods that are high in starch and sugar. Avoid fried foods. Lose weight if you are overweight. Keep track of how much salt (sodium) you eat. This is important if you have high blood pressure. Ask your doctor to tell you more about this. Try to add vegetarian meals each week. Fats Choose healthy fats. These include olive oil and canola oil, flaxseeds, walnuts, almonds, and seeds. Eat more omega-3 fats. These  include salmon, mackerel, sardines, tuna, flaxseed oil, and ground flaxseeds. Try to eat fish at least 2 times each week. Check food labels. Avoid foods with trans fats or high amounts of saturated fat. Limit saturated fats. These are often found in animal products, such as meats, butter, and cream. These are also found in plant foods, such as palm oil, palm kernel oil, and coconut oil. Avoid foods with partially hydrogenated oils in them. These have trans fats. Examples are stick margarine, some tub margarines, cookies, crackers, and other baked goods. What foods should I eat? Fruits All fresh, canned (in natural juice), or frozen fruits. Vegetables Fresh or frozen vegetables (raw, steamed, roasted, or grilled). Green salads. Grains Most grains. Choose whole wheat and whole grains most of the time. Rice and pasta, including brown rice and pastas made with whole wheat. Meats and other proteins Lean, well-trimmed beef, veal, pork, and lamb. Chicken and [place] without skin. All fish and shellfish. Wild duck, rabbit, pheasant, and venison. Egg whites or low-cholesterol egg substitutes. Dried beans, peas, lentils, and tofu. Seeds and most nuts. Dairy Low-fat or nonfat cheeses, including  ricotta and mozzarella. Skim or 1% milk that is liquid, powdered, or evaporated. Buttermilk that is made with low-fat milk. Nonfat or low-fat yogurt. Fats and oils Non-hydrogenated (trans-free) margarines. Vegetable oils, including soybean, sesame, sunflower, olive, peanut, safflower, corn, canola, and cottonseed. Salad dressings or mayonnaise made with a vegetable oil. Beverages Mineral water. Coffee and tea. Diet carbonated beverages. Sweets and desserts Sherbet, gelatin, and fruit ice. Small amounts of dark chocolate. Limit all sweets and desserts. Seasonings and condiments All seasonings and condiments. The items listed above may not be a complete list of foods and drinks you can eat. Contact a dietitian for  more options. What foods should I avoid? Fruits Canned fruit in heavy syrup. Fruit in cream or butter sauce. Fried fruit. Limit coconut. Vegetables Vegetables cooked in cheese, cream, or butter sauce. Fried vegetables. Grains Breads that are made with saturated or trans fats, oils, or whole milk. Croissants. Sweet rolls. Donuts. High-fat crackers, such as cheese crackers. Meats and other proteins Fatty meats, such as hot dogs, ribs, sausage, bacon, rib-eye roast or steak. High-fat deli meats, such as salami and bologna. Caviar. Domestic duck and goose. Organ meats, such as liver. Dairy Cream, sour cream, cream cheese, and creamed cottage cheese. Whole-milk cheeses. Whole or 2% milk that is liquid, evaporated, or condensed. Whole buttermilk. Cream sauce or high-fat cheese sauce. Yogurt that is made from whole milk. Fats and oils Meat fat, or shortening. Cocoa butter, hydrogenated oils, palm oil, coconut oil, palm kernel oil. Solid fats and shortenings, including bacon fat, salt pork, lard, and butter. Nondairy cream substitutes. Salad dressings with cheese or sour cream. Beverages Regular sodas and juice drinks with added sugar. Sweets and desserts Frosting. Pudding. Cookies. Cakes. Pies. Milk chocolate or white chocolate. Buttered syrups. Full-fat ice cream or ice cream drinks. The items listed above may not be a complete list of foods and drinks to avoid. Contact a dietitian for more information. Summary Heart-healthy meal planning includes eating less unhealthy fats, eating more healthy fats, and making other changes in your diet. Eat a balanced diet. This includes fruits and vegetables, low-fat or nonfat dairy, lean protein, nuts and legumes, whole grains, and heart-healthy oils and fats. This information is not intended to replace advice given to you by your health care provider. Make sure you discuss any questions you have with your health care provider. Document Revised: 03/07/2021  Document Reviewed: 03/07/2021 Elsevier Patient Education  2024 [employer].

## 2022-07-11 NOTE — Progress Notes (Signed)
Office Visit    Patient Name: Heidi Holt Date of Encounter: 07/11/2022  PCP:  Tally Joe, MD   [facility]  Cardiologist:  Lance Muss, MD  Advanced Practice Provider:  No care team member to display Electrophysiologist:  None   HPI    Heidi Holt is a 62 y.o. female with a past medical history of CAD, HTN, hyperlipidemia presents today for follow-up for fatigue.  She was recently seen 11/2021 by Dr. Eldridge Dace.  She has had hypertension and hyperlipidemia for over 10 years.  Intolerant to statins (Lipitor she believes) due to psoriatic arthritis.  BNP was elevated several years ago.  Strong history of CAD with father having MI at age 61.  Underwent outpatient coronary CTA which showed CTO of mid RCA with collateral blood flow.  Cardiac catheterization 6/23 with single-vessel CAD and CTO of mid RCA, brisk left-to-right collaterals.  Unsuccessful attempt at PCI of the mid RCA despite using multiple wires.  Plan for DAPT with ASA/Plavix for now.  Maybe bring back for CTO PCI attempt in the future.  Metoprolol 12.5 mg twice daily and was asked to stop HCTZ at her last appointment.  Lipid clinic referral as she is statin intolerant.  Seen by APP in 7/23.  Since discontinued HCTZ she gained 8 pounds.  Had some notated lower extremity edema and hand swelling.  Some orthopnea and shortness of breath.  She was requesting to go back on the HCTZ.  No chest pain.  She continued to have some dyspnea on exertion and fatigue.  Only takes metoprolol when her heart races once daily.  Diagnosed with fibromyalgia and diagnosed with psoriatic arthritis.  Not walking much.  She was last seen by me 02/27/22, and she endorsed heart palpitations or "fluttering".  She saw Dr. Eldridge Dace back in October and she states that he recommended doing a "corkscrew" procedure.  We did go through her cardiac catheterization which showed a CTO of RCA with collateral blood flow.  We discussed  adding an monitor today to see if she is having episodes of SVT.  She is also volume overloaded today and has some shortness of breath as well as lower extremity edema.  We discussed adding a as needed diuretic however, if she is having dizziness she is asked not to take this.  He was last seen by me 03/2022, she states that she is having some squeezing on the left side of her chest and down the left side. This occurs when she is laying down. She is also having some pain in the center of her chest which randomly comes and goes. She is also having SOB and lightheadedness. She does have presyncope as well. No vision narrowing and no difference with her palpitations with the addition of metoprolol. Palpitations occur independently of the pain. Monitor reviewed with the patient which did not show any dangerous arrhythmias.   We discussed her cardiac cath from June with the CTO of her mid RCA and how a stent was unable to be placed given her anatomy. She does have collaterals which feed this area of the heart. She would like this fixed since she is still having symptoms. I discussed her case with Dr. Eldridge Dace and he spoke with the patient today.   Today, she tells me that she cannot take the Imdur medication.  It made her have a headache.  She still has some fatigue and dizziness/lightheadedness.  She states its mostly in the morning hours.  Occasionally when she is sitting down and then standing up.  Seems like it could be some orthostatic hypotension.  She states her last blood pressure was 113/69.  Today, 116/74.  She is lost about 40 pounds.  Used to be up to 220 and now down to 188 (by her home scale).  She states that she is chronically low in B12 and iron and this may be contributing to her fatigue.  Suggested a multivitamin to supplement.  No bleeding on Plavix but does bruise easily.  She has been trying to get some exercise in her pool.  Reports no shortness of breath nor dyspnea on exertion. Reports no  chest pain, pressure, or tightness. No edema, orthopnea, PND. Reports no palpitations.    Past Medical History    Past Medical History:  Diagnosis Date   Allergy    Anxiety    Arthritis    Chronic kidney disease, stage 3a (HCC)    Common migraine with intractable migraine 02/20/2020   Depression    Gallstones    GERD (gastroesophageal reflux disease)    History of COVID-19 2019   HTN (hypertension)    Hypercholesterolemia    Hypertension    Kidney stone    Psoriasis    Thyroid disease    hx of hypo, pt states has resolved, no meds needed   Past Surgical History:  Procedure Laterality Date   ABDOMINAL HYSTERECTOMY     BREAST LUMPECTOMY     left   CHOLECYSTECTOMY     COLONOSCOPY     LEFT HEART CATH AND CORONARY ANGIOGRAPHY N/A 07/28/2021   Procedure: LEFT HEART CATH AND CORONARY ANGIOGRAPHY;  Surgeon: Corky Crafts, MD;  Location: [facility];  Service: Cardiovascular;  Laterality: N/A;   UPPER GASTROINTESTINAL ENDOSCOPY      Allergies  Allergies  Allergen Reactions   Amlodipine Swelling   Crestor [Rosuvastatin] Other (See Comments)    myalgias   Hycodan [Hydrocodone Bit-Homatrop Mbr] Other (See Comments)    "too strong"   Lipitor [Atorvastatin] Other (See Comments)    myalgias   Methotrexate     Other reaction(s): headaches   Topamax [Topiramate] Nausea And Vomiting   Latex Swelling and Rash     EKGs/Labs/Other Studies Reviewed:   The following studies were reviewed today:   Cardiac catheterization 07/2021    Mid RCA lesion is 100% stenosed.  Brisk left to right collaterals.  This was a chronic total occlusion.  Attempt at PCI of the mid RCA was unsuccessful despite using multiple wires.  Severe right subclavian tortuosity necessitated using an 85 cm destination sheath.  Support from the right radial approach was challenging.   Ost RCA to Prox RCA lesion is 25% stenosed.   The left ventricular systolic function is normal.   LV end diastolic  pressure is normal.   The left ventricular ejection fraction is 55-65% by visual estimate.   There is no aortic valve stenosis.   Single-vessel CAD with a chronic total occlusion of the mid RCA.  Brisk left to right collaterals.  Left system is widely patent.  Normal LV function.  Normal LVEDP.  Continue medical therapy.  If she had refractory symptoms, could consider intervention from the femoral approach for better support.  EKG:  EKG is ordered today.  Normal sinus rhythm, rate 70 bpm Recent Labs: 11/29/2021: ALT 21 02/27/2022: BUN 19; Creatinine, Ser 0.90; Hemoglobin 11.7; Magnesium 2.1; Platelets 196; Potassium 3.6; Sodium 139  Recent Lipid Panel  Component Value Date/Time   CHOL 112 11/29/2021 0810   TRIG 121 11/29/2021 0810   HDL 46 11/29/2021 0810   CHOLHDL 2.4 11/29/2021 0810   LDLCALC 44 11/29/2021 0810    Home Medications   Current Meds  Medication Sig   Adalimumab-atto (AMJEVITA) 40 MG/0.8ML SOAJ every 14 (fourteen) days.   aspirin EC 81 MG tablet Take 1 tablet (81 mg total) by mouth daily.  Swallow whole, do not chew or crush.   citalopram (CELEXA) 40 MG tablet Take 40 mg by mouth daily.   clopidogrel (PLAVIX) 75 MG tablet Take 1 tablet (75 mg total) by mouth daily.   ezetimibe (ZETIA) 10 MG tablet Take 1 tablet (10 mg total) by mouth daily.   famotidine (PEPCID AC MAXIMUM STRENGTH) 20 MG tablet Take 20 mg by mouth at bedtime.   fluocinonide cream (LIDEX) 0.05 % Apply topically as needed for rash.   fluticasone (FLONASE) 50 MCG/ACT nasal spray Place 1 spray into both nostrils daily as needed for allergies or rhinitis.   furosemide (LASIX) 20 MG tablet Take one tablet by mouth as needed for shortness of breath, fluid overload or lower extremity edema.   hydrochlorothiazide (HYDRODIURIL) 25 MG tablet Take 1 tablet (25 mg total) by mouth daily.   isosorbide mononitrate (IMDUR) 30 MG 24 hr tablet Take 1 tablet (30 mg total) by mouth daily.   leflunomide (ARAVA) 20 MG  tablet Take 20 mg by mouth daily.   losartan (COZAAR) 100 MG tablet Take 100 mg by mouth daily.   metoprolol tartrate (LOPRESSOR) 25 MG tablet Take 1 tablet (25 mg total) by mouth 2 (two) times daily.   Multiple Vitamins-Minerals (MULTIVITAMIN WITH MINERALS) tablet Take 1 tablet by mouth daily.   nitroGLYCERIN (NITROSTAT) 0.4 MG SL tablet Place 1 tablet (0.4 mg total) under the tongue every 5 (five) minutes as needed for chest pain.   OVER THE COUNTER MEDICATION Take 6 mg by mouth daily. Balance of Nature Fruit  Vegetable   pantoprazole (PROTONIX) 40 MG tablet TAKE 1 TABLET BY MOUTH BEFORE BREAKFAST . APPOINTMENT REQUIRED FOR FUTURE REFILLS   potassium chloride (KLOR-CON) 10 MEQ tablet Take 1 tablet (10 mEq total) by mouth daily as needed (if you take lasix).   rizatriptan (MAXALT) 10 MG tablet Take 1 tablet (10 mg total) by mouth 3 (three) times daily as needed for migraine. May repeat in 2 hours if needed   rosuvastatin (CRESTOR) 20 MG tablet Take 1 tablet (20 mg total) by mouth daily.     Review of Systems      All other systems reviewed and are otherwise negative except as noted above.  Physical Exam    VS:  BP 116/74   Pulse 70   Ht 5' 6.5" (1.689 m)   Wt 193 lb (87.5 kg)   SpO2 98%   BMI 30.68 kg/m  , BMI Body mass index is 30.68 kg/m.  Wt Readings from Last 3 Encounters:  07/11/22 193 lb (87.5 kg)  03/24/22 216 lb 3.2 oz (98.1 kg)  02/27/22 219 lb 3.2 oz (99.4 kg)     GEN: Well nourished, well developed, in no acute distress. HEENT: normal. Neck: Supple, no JVD, carotid bruits, or masses. Cardiac: RRR, no murmurs, rubs, or gallops. No clubbing, cyanosis, edema.  Radials/PT 2+ and equal bilaterally.  Respiratory:  Respirations regular and unlabored, clear to auscultation bilaterally. GI: Soft, nontender, nondistended. MS: No deformity or atrophy. Skin: Warm and dry, no rash. Neuro:  Strength and sensation are  intact. Psych: Normal affect.  Assessment & Plan     Fatigue/palpitations/SOB/lightheaded -still having some lightheadedness/fatigue  -BP stable today, low normal -monitor reviewed and negative for dangerous arrhythmias  -daily potassium x 2 weeks and recheck a BMET -continue metoprolol, palpitations have improved -she took the nitro once, renewed nitro  -multivitamin encouraged  CAD -stable on cardiac cath -CTO of RCA with collateral blood flow -continue current medications with ASA and as needed nitro  HTN -well controlled -cut losartan down to 50mg  daily, she has lost some weight recently and BP is low normal today -hopefully this will assist in some of her orthostatic hypotension symptoms -increase hydration  Hyperlipidemia -LDL 39 -cont crestor 20mg  daily and zetia 10mg  daily    Disposition: Follow up 3-4 months with Lance Muss, MD or APP.  Signed, Sharlene Dory, PA-C 07/11/2022, 2:15 PM [facility]

## 2022-07-25 ENCOUNTER — Ambulatory Visit: Payer: 59 | Attending: Physician Assistant

## 2022-07-25 DIAGNOSIS — R5383 Other fatigue: Secondary | ICD-10-CM | POA: Diagnosis not present

## 2022-07-26 LAB — BASIC METABOLIC PANEL
BUN/Creatinine Ratio: 17 (ref 12–28)
BUN: 15 mg/dL (ref 8–27)
CO2: 22 mmol/L (ref 20–29)
Calcium: 9.5 mg/dL (ref 8.7–10.3)
Chloride: 103 mmol/L (ref 96–106)
Creatinine, Ser: 0.9 mg/dL (ref 0.57–1.00)
Glucose: 98 mg/dL (ref 70–99)
Potassium: 3.6 mmol/L (ref 3.5–5.2)
Sodium: 140 mmol/L (ref 134–144)
eGFR: 73 mL/min/{1.73_m2} (ref >59)

## 2022-07-28 ENCOUNTER — Telehealth: Payer: Self-pay | Admitting: Interventional Cardiology

## 2022-07-28 NOTE — Telephone Encounter (Signed)
Patient was returning call for results. Please advise °

## 2022-07-28 NOTE — Telephone Encounter (Signed)
The patient has been notified of the result and verbalized understanding.  All questions (if any) were answered. Asencion Gowda, LPN 1/61/0960 45:40 AM

## 2022-08-18 DIAGNOSIS — U071 COVID-19: Secondary | ICD-10-CM | POA: Diagnosis not present

## 2022-11-14 ENCOUNTER — Ambulatory Visit: Payer: 59 | Admitting: Interventional Cardiology

## 2022-11-23 DIAGNOSIS — Z6826 Body mass index (BMI) 26.0-26.9, adult: Secondary | ICD-10-CM | POA: Diagnosis not present

## 2022-11-23 DIAGNOSIS — Z01419 Encounter for gynecological examination (general) (routine) without abnormal findings: Secondary | ICD-10-CM | POA: Diagnosis not present

## 2022-11-23 DIAGNOSIS — Z1231 Encounter for screening mammogram for malignant neoplasm of breast: Secondary | ICD-10-CM | POA: Diagnosis not present

## 2023-01-22 NOTE — Progress Notes (Unsigned)
  Cardiology Office Note:  .   Date:  01/22/2023  ID:  Heidi Holt, DOB [birth year], MRN [id number] PCP: Tally Joe, MD  [facility] Providers Cardiologist:  Lance Muss, MD { Click to update primary MD,subspecialty MD or APP then REFRESH:1}   History of Present Illness: .   Dec. 10, 2024  Seen with husband, Heidi Holt.   Heidi Holt is a 62 y.o. female patient of Dr. Eldridge Dace. She has a hx of CAD, HTN, HLD  I am meeting her for the first time today   Strong family hx of CAD Had Cor CTA which showed CTO of her RCA  Cath 6/23 showed CTO of the mid RA, brisk left to right collateral s  Unsuccessful PCI attempt of her mid RCA   Is chronically fatigued , no energy  Is retired   Her LV function is normal by echo   Has lost 80 lbs over the past 10 months    Father and mother died of MI at early ag Father at 5 Mother at 42     ROS: ***  Studies Reviewed: .        *** Risk Assessment/Calculations:   {Does this patient have ATRIAL FIBRILLATION?[phone]} No BP recorded.  {Refresh Note OR Click here to enter BP  :1}***       Physical Exam:   VS:  There were no vitals taken for this visit.   Wt Readings from Last 3 Encounters:  07/11/22 193 lb (87.5 kg)  03/24/22 216 lb 3.2 oz (98.1 kg)  02/27/22 219 lb 3.2 oz (99.4 kg)    GEN: Well nourished, well developed in no acute distress NECK: No JVD; No carotid bruits CARDIAC: ***RRR, no murmurs, rubs, gallops RESPIRATORY:  Clear to auscultation without rales, wheezing or rhonchi  ABDOMEN: Soft, non-tender, non-distended EXTREMITIES:  No edema; No deformity   ASSESSMENT AND PLAN: .   ***    {Are you ordering a CV Procedure (e.g. stress test, cath, DCCV, TEE, etc)?   Press F2        :[id number]}  Dispo: ***  Signed, Kristeen Miss, MD

## 2023-01-23 ENCOUNTER — Ambulatory Visit: Payer: 59 | Attending: Cardiovascular Disease | Admitting: Cardiovascular Disease

## 2023-01-23 ENCOUNTER — Encounter: Payer: Self-pay | Admitting: Cardiovascular Disease

## 2023-01-23 VITALS — BP 104/68 | HR 80 | Ht 66.5 in | Wt 151.2 lb

## 2023-01-23 DIAGNOSIS — I10 Essential (primary) hypertension: Secondary | ICD-10-CM

## 2023-01-23 DIAGNOSIS — Z79899 Other long term (current) drug therapy: Secondary | ICD-10-CM

## 2023-01-23 DIAGNOSIS — E785 Hyperlipidemia, unspecified: Secondary | ICD-10-CM | POA: Diagnosis not present

## 2023-01-23 DIAGNOSIS — R5383 Other fatigue: Secondary | ICD-10-CM | POA: Diagnosis not present

## 2023-01-23 MED ORDER — METOPROLOL TARTRATE 25 MG PO TABS: 12.5000 mg | ORAL_TABLET | Freq: Two times a day (BID) | ORAL | 3 refills | Status: DC

## 2023-01-23 NOTE — Patient Instructions (Signed)
Medication Instructions:  DECREASE Metoprolol Tartrate to 1/2 tablet (12.5mg ) twice daily DECREASE Furosemide to only as needed **only take Potassium if taking Furosemide** *If you need a refill on your cardiac medications before your next appointment, please call your pharmacy*  Lab Work: Lipids, ALT, BMET today If you have labs (blood work) drawn today and your tests are completely normal, you will receive your results only by: MyChart Message (if you have MyChart) OR A paper copy in the mail If you have any lab test that is abnormal or we need to change your treatment, we will call you to review the results.  Follow-Up: At [facility], you and your health needs are our priority.  As part of our continuing mission to provide you with exceptional heart care, we have created designated Provider Care Teams.  These Care Teams include your primary Cardiologist (physician) and Advanced Practice Providers (APPs -  Physician Assistants and Nurse Practitioners) who all work together to provide you with the care you need, when you need it.  Your next appointment:   3 month(s)  Provider:   Kristeen Miss, MD

## 2023-01-24 ENCOUNTER — Telehealth: Payer: Self-pay

## 2023-01-24 DIAGNOSIS — E876 Hypokalemia: Secondary | ICD-10-CM

## 2023-01-24 LAB — LIPID PANEL
Chol/HDL Ratio: 2.8 {ratio} (ref 0.0–4.4)
Cholesterol, Total: 130 mg/dL (ref 100–199)
HDL: 47 mg/dL (ref >39)
LDL Chol Calc (NIH): 57 mg/dL (ref 0–99)
Triglycerides: 152 mg/dL — ABNORMAL HIGH (ref 0–149)
VLDL Cholesterol Cal: 26 mg/dL (ref 5–40)

## 2023-01-24 LAB — BASIC METABOLIC PANEL
BUN/Creatinine Ratio: 13 (ref 12–28)
BUN: 17 mg/dL (ref 8–27)
CO2: 25 mmol/L (ref 20–29)
Calcium: 10.3 mg/dL (ref 8.7–10.3)
Chloride: 93 mmol/L — ABNORMAL LOW (ref 96–106)
Creatinine, Ser: 1.36 mg/dL — ABNORMAL HIGH (ref 0.57–1.00)
Glucose: 105 mg/dL — ABNORMAL HIGH (ref 70–99)
Potassium: 2.9 mmol/L — CL (ref 3.5–5.2)
Sodium: 138 mmol/L (ref 134–144)
eGFR: 44 mL/min/{1.73_m2} — ABNORMAL LOW (ref >59)

## 2023-01-24 LAB — ALT: ALT: 15 [IU]/L (ref 0–32)

## 2023-01-24 NOTE — Telephone Encounter (Signed)
-----   Message from Kristeen Miss sent at 01/24/2023  6:44 AM EST ----- Labs show that she is volume depleted ( elevated creatinine,  low chloride, low potassium)   She was taking lasix and hydrochlorothiazide.  This explains her low BP and generalized weakness. We have reduced her lasix to as needed.  I would suggest that she does not need any lasix very often.  I would like for her to hold her hydrochlorothiazide for the 3 days.     Potassium is low.   Have her take Kdur 20 meq twice a day today and tomorrow  Then resume hydrochlorothiazide 25 mg a day and Kdur 10 meq a day .  Recheck BMP in 1-2 weeks

## 2023-01-24 NOTE — Telephone Encounter (Signed)
Pt returning nurse's call. Please advise

## 2023-01-24 NOTE — Telephone Encounter (Signed)
Patient has viewed MD's comments and recommendations via MyChart. Called her to review verbally, but no answer. Left detailed message per [facility] permission. Asked that she call office back or send MyChart message if any questions. Repeat BMET order placed at this time and released so it can be drawn next week.

## 2023-01-24 NOTE — Telephone Encounter (Signed)
  Labs show that she is volume depleted ( elevated creatinine,  low chloride, low potassium)   She was taking lasix and hydrochlorothiazide.   This explains her low BP and generalized weakness. We have reduced her lasix to as needed.   I would suggest that she does not need any lasix very often.  I would like for her to hold her hydrochlorothiazide for the 3 days.     Potassium is low.   Have her take Kdur 20 meq twice a day today and tomorrow Then resume hydrochlorothiazide 25 mg a day and Kdur 10 meq a day .   Recheck BMP in 1-2 weeks  Written by Vesta Mixer, MD on 01/24/2023  6:44 AM EST   Left message for patient to call back

## 2023-01-24 NOTE — Telephone Encounter (Signed)
Pt returning nurses call regarding results. Please advise 

## 2023-01-24 NOTE — Telephone Encounter (Signed)
Left message for patient to call back  

## 2023-01-24 NOTE — Telephone Encounter (Signed)
Completed in myChart encounter.

## 2023-01-25 DIAGNOSIS — Z5181 Encounter for therapeutic drug level monitoring: Secondary | ICD-10-CM | POA: Diagnosis not present

## 2023-02-20 ENCOUNTER — Ambulatory Visit: Payer: 59 | Admitting: Internal Medicine

## 2023-05-01 ENCOUNTER — Other Ambulatory Visit (HOSPITAL_COMMUNITY): Payer: Self-pay

## 2023-05-07 ENCOUNTER — Encounter: Payer: Self-pay | Admitting: Cardiovascular Disease

## 2023-05-07 NOTE — Progress Notes (Unsigned)
  Cardiology Office Note:  .   Date:  05/11/2023  ID:  Heidi Holt, DOB 1960-03-30, MRN [id number] PCP: Tally Joe, MD   [facility] Providers Cardiologist:  Lance Muss, MD {  History of Present Illness: .   Dec. 10, 2024  Seen with husband, Heidi Holt.   Heidi Holt is a 63 y.o. female patient of Dr. Eldridge Dace. She has a hx of CAD, HTN, HLD  I am meeting her for the first time today   Strong family hx of CAD Had Cor CTA which showed CTO of her RCA  Cath 6/23 showed CTO of the mid RA, brisk left to right collateral s  Unsuccessful PCI attempt of her mid RCA   Is chronically fatigued , no energy  Is retired   Her LV function is normal by echo   Has lost 80 lbs over the past 10 months    Father and mother died of MI at early ag Father at 13 Mother at 87  May 11, 2023 Heidi Holt is seen for follow up of her HTN, dehydration,  CAD  Hypokalemia  Coronary CTA reveals a CTO of her mid RCA  Had an unsuccessful attempt at PCI of her RCA   Is chronically fatigued  Clinically , I thought she was volume depleted at her previous office visit  She feels about the same  Is not getting regular exercise  Gets some exercise Is still fatigued all day  Takes her a long time to get to sleep  Is up and down frequently through the night   I've recommended Melatonin  Or Q-nol sleep aid    Her LDL is 57       ROS:   Studies Reviewed: Marland Kitchen   EKG Interpretation Date/Time:  Friday May 11 2023 10:32:10 EDT Ventricular Rate:  75 PR Interval:  92 QRS Duration:  108 QT Interval:  416 QTC Calculation: 464 R Axis:   70  Text Interpretation: Sinus rhythm with short PR Nonspecific ST abnormality When compared with ECG of 28-Jul-2021 11:09, PR interval has decreased Confirmed by Kristeen Miss ([id number]) on 05/11/2023 10:54:21 AM     Risk Assessment/Calculations:       Physical Exam:    Physical Exam: Blood pressure 126/66, pulse 78, height 5' 6.5" (1.689 m), weight 145  lb 9.6 oz (66 kg), SpO2 99%.       GEN:  Well nourished, well developed in no acute distress HEENT: Normal NECK: No JVD; No carotid bruits LYMPHATICS: No lymphadenopathy CARDIAC: RRR , no murmurs, rubs, gallops RESPIRATORY:  Clear to auscultation without rales, wheezing or rhonchi  ABDOMEN: Soft, non-tender, non-distended MUSCULOSKELETAL:  No edema; No deformity  SKIN: Warm and dry NEUROLOGIC:  Alert and oriented x 3   ASSESSMENT AND PLAN: .      CAD:  no chest pain .  Has a CTO of her right coronary artery.  2.  Chronic fatigue, lack of energy .  She admits to not sleeping well.  I suspect that her chronic fatigue is due to her lack of restorative sleep.  I have suggested she try taking some melatonin or Qunol sleep aid.  She may want to discuss with her primary MD    3.  HTN:  cont current meds.                 Dispo: 3 months    Signed, Kristeen Miss, MD

## 2023-05-11 ENCOUNTER — Ambulatory Visit: Payer: 59 | Attending: Internal Medicine | Admitting: Cardiovascular Disease

## 2023-05-11 ENCOUNTER — Encounter: Payer: Self-pay | Admitting: Cardiovascular Disease

## 2023-05-11 VITALS — BP 126/66 | HR 78 | Ht 66.5 in | Wt 145.6 lb

## 2023-05-11 DIAGNOSIS — I10 Essential (primary) hypertension: Secondary | ICD-10-CM

## 2023-05-11 DIAGNOSIS — R002 Palpitations: Secondary | ICD-10-CM | POA: Diagnosis not present

## 2023-05-11 DIAGNOSIS — I2510 Atherosclerotic heart disease of native coronary artery without angina pectoris: Secondary | ICD-10-CM | POA: Diagnosis not present

## 2023-05-11 NOTE — Patient Instructions (Signed)
 Follow-Up: At [facility], you and your health needs are our priority.  As part of our continuing mission to provide you with exceptional heart care, our providers are all part of one team.  This team includes your primary Cardiologist (physician) and Advanced Practice Providers or APPs (Physician Assistants and Nurse Practitioners) who all work together to provide you with the care you need, when you need it.  Your next appointment:   1 year(s)  Provider:   Clearance Coots, MD    1st Floor: - Lobby - Registration  - Pharmacy  - Lab - Cafe  2nd Floor: - PV Lab - Diagnostic Testing (echo, CT, nuclear med)  3rd Floor: - Vacant  4th Floor: - TCTS (cardiothoracic surgery) - AFib Clinic - Structural Heart Clinic - Vascular Surgery  - Vascular Ultrasound  5th Floor: - HeartCare Cardiology (general and EP) - Clinical Pharmacy for coumadin, hypertension, lipid, weight-loss medications, and med management appointments    Valet parking services will be available as well.

## 2023-06-15 ENCOUNTER — Other Ambulatory Visit: Payer: Self-pay | Admitting: Interventional Cardiology

## 2023-07-09 DIAGNOSIS — H9201 Otalgia, right ear: Secondary | ICD-10-CM | POA: Diagnosis not present

## 2023-07-09 DIAGNOSIS — R5381 Other malaise: Secondary | ICD-10-CM | POA: Diagnosis not present

## 2023-07-09 DIAGNOSIS — R051 Acute cough: Secondary | ICD-10-CM | POA: Diagnosis not present

## 2023-07-09 DIAGNOSIS — J180 Bronchopneumonia, unspecified organism: Secondary | ICD-10-CM | POA: Diagnosis not present

## 2023-07-09 DIAGNOSIS — R509 Fever, unspecified: Secondary | ICD-10-CM | POA: Diagnosis not present

## 2023-08-03 ENCOUNTER — Other Ambulatory Visit: Payer: Self-pay | Admitting: Cardiovascular Disease

## 2023-08-03 DIAGNOSIS — R5383 Other fatigue: Secondary | ICD-10-CM

## 2023-08-03 DIAGNOSIS — I10 Essential (primary) hypertension: Secondary | ICD-10-CM

## 2023-08-03 DIAGNOSIS — E785 Hyperlipidemia, unspecified: Secondary | ICD-10-CM

## 2023-08-03 DIAGNOSIS — Z79899 Other long term (current) drug therapy: Secondary | ICD-10-CM

## 2023-11-14 ENCOUNTER — Other Ambulatory Visit: Payer: Self-pay | Admitting: Interventional Cardiology

## 2023-11-14 DIAGNOSIS — M79602 Pain in left arm: Secondary | ICD-10-CM | POA: Diagnosis not present

## 2023-11-14 DIAGNOSIS — Z79899 Other long term (current) drug therapy: Secondary | ICD-10-CM | POA: Diagnosis not present

## 2023-11-14 DIAGNOSIS — L409 Psoriasis, unspecified: Secondary | ICD-10-CM | POA: Diagnosis not present

## 2023-11-14 DIAGNOSIS — I10 Essential (primary) hypertension: Secondary | ICD-10-CM

## 2023-11-14 DIAGNOSIS — L4050 Arthropathic psoriasis, unspecified: Secondary | ICD-10-CM | POA: Diagnosis not present

## 2023-11-14 DIAGNOSIS — E785 Hyperlipidemia, unspecified: Secondary | ICD-10-CM

## 2023-11-14 DIAGNOSIS — M1990 Unspecified osteoarthritis, unspecified site: Secondary | ICD-10-CM | POA: Diagnosis not present

## 2023-11-14 DIAGNOSIS — R5383 Other fatigue: Secondary | ICD-10-CM

## 2023-11-14 DIAGNOSIS — Z23 Encounter for immunization: Secondary | ICD-10-CM | POA: Diagnosis not present

## 2023-11-15 MED ORDER — METOPROLOL TARTRATE 25 MG PO TABS
12.5000 mg | ORAL_TABLET | Freq: Two times a day (BID) | ORAL | 1 refills | Status: AC
Start: 2023-11-15 — End: ?

## 2023-11-27 DIAGNOSIS — Z01419 Encounter for gynecological examination (general) (routine) without abnormal findings: Secondary | ICD-10-CM | POA: Diagnosis not present

## 2023-11-27 DIAGNOSIS — Z6822 Body mass index (BMI) 22.0-22.9, adult: Secondary | ICD-10-CM | POA: Diagnosis not present

## 2023-11-27 DIAGNOSIS — Z13820 Encounter for screening for osteoporosis: Secondary | ICD-10-CM | POA: Diagnosis not present

## 2023-11-27 DIAGNOSIS — Z1272 Encounter for screening for malignant neoplasm of vagina: Secondary | ICD-10-CM | POA: Diagnosis not present

## 2023-11-27 DIAGNOSIS — Z1231 Encounter for screening mammogram for malignant neoplasm of breast: Secondary | ICD-10-CM | POA: Diagnosis not present

## 2023-11-27 DIAGNOSIS — F32A Depression, unspecified: Secondary | ICD-10-CM | POA: Diagnosis not present

## 2023-11-27 DIAGNOSIS — N952 Postmenopausal atrophic vaginitis: Secondary | ICD-10-CM | POA: Diagnosis not present

## 2023-12-03 ENCOUNTER — Other Ambulatory Visit: Payer: Self-pay | Admitting: Obstetrics and Gynecology

## 2023-12-03 DIAGNOSIS — R928 Other abnormal and inconclusive findings on diagnostic imaging of breast: Secondary | ICD-10-CM

## 2023-12-11 ENCOUNTER — Ambulatory Visit
Admission: RE | Admit: 2023-12-11 | Discharge: 2023-12-11 | Disposition: A | Discharge status: Home / Self Care | Source: Ambulatory Visit | Attending: Obstetrics and Gynecology | Admitting: Obstetrics and Gynecology

## 2023-12-11 ENCOUNTER — Ambulatory Visit
Admission: RE | Admit: 2023-12-11 | Discharge: 2023-12-11 | Disposition: A | Source: Ambulatory Visit | Attending: Obstetrics and Gynecology | Admitting: Obstetrics and Gynecology

## 2023-12-11 DIAGNOSIS — R928 Other abnormal and inconclusive findings on diagnostic imaging of breast: Secondary | ICD-10-CM

## 2023-12-11 DIAGNOSIS — N6489 Other specified disorders of breast: Secondary | ICD-10-CM | POA: Diagnosis not present

## 2024-01-16 DIAGNOSIS — L4050 Arthropathic psoriasis, unspecified: Secondary | ICD-10-CM | POA: Diagnosis not present

## 2024-01-16 DIAGNOSIS — M1990 Unspecified osteoarthritis, unspecified site: Secondary | ICD-10-CM | POA: Diagnosis not present

## 2024-01-16 DIAGNOSIS — Z79899 Other long term (current) drug therapy: Secondary | ICD-10-CM | POA: Diagnosis not present

## 2024-01-16 DIAGNOSIS — L409 Psoriasis, unspecified: Secondary | ICD-10-CM | POA: Diagnosis not present

## 2024-01-16 DIAGNOSIS — M8580 Other specified disorders of bone density and structure, unspecified site: Secondary | ICD-10-CM | POA: Diagnosis not present
